# Patient Record
Sex: Female | Born: 1975 | Race: Black or African American | Hispanic: No | Marital: Single | Smoking: Never smoker
Health system: Southern US, Community
[De-identification: ages and names within clinical notes are randomized; demographics above are authoritative.]

## PROBLEM LIST (undated history)

## (undated) DIAGNOSIS — D251 Intramural leiomyoma of uterus: Secondary | ICD-10-CM

## (undated) DIAGNOSIS — T7840XA Allergy, unspecified, initial encounter: Secondary | ICD-10-CM

## (undated) DIAGNOSIS — R51 Headache: Secondary | ICD-10-CM

## (undated) DIAGNOSIS — L709 Acne, unspecified: Secondary | ICD-10-CM

## (undated) DIAGNOSIS — I10 Essential (primary) hypertension: Secondary | ICD-10-CM

## (undated) DIAGNOSIS — K219 Gastro-esophageal reflux disease without esophagitis: Secondary | ICD-10-CM

## (undated) DIAGNOSIS — D649 Anemia, unspecified: Secondary | ICD-10-CM

## (undated) HISTORY — DX: Allergy, unspecified, initial encounter: T78.40XA

## (undated) HISTORY — DX: Anemia, unspecified: D64.9

## (undated) HISTORY — DX: Essential (primary) hypertension: I10

## (undated) HISTORY — PX: WISDOM TOOTH EXTRACTION: SHX21

## (undated) HISTORY — DX: Headache: R51

## (undated) HISTORY — DX: Gastro-esophageal reflux disease without esophagitis: K21.9

## (undated) HISTORY — DX: Acne, unspecified: L70.9

## (undated) SURGERY — Surgical Case
Anesthesia: *Unknown

---

## 1999-11-17 ENCOUNTER — Emergency Department (HOSPITAL_COMMUNITY): Admission: EM | Admit: 1999-11-17 | Discharge: 1999-11-17 | Payer: Self-pay | Admitting: Emergency Medicine

## 1999-12-09 ENCOUNTER — Encounter: Payer: Self-pay | Admitting: Emergency Medicine

## 1999-12-09 ENCOUNTER — Emergency Department (HOSPITAL_COMMUNITY): Admission: EM | Admit: 1999-12-09 | Discharge: 1999-12-09 | Payer: Self-pay | Admitting: Emergency Medicine

## 2004-06-11 ENCOUNTER — Emergency Department (HOSPITAL_COMMUNITY): Admission: EM | Admit: 2004-06-11 | Discharge: 2004-06-11 | Payer: Self-pay | Admitting: Family Medicine

## 2005-05-23 ENCOUNTER — Ambulatory Visit: Payer: Self-pay | Admitting: Internal Medicine

## 2007-03-19 DIAGNOSIS — J309 Allergic rhinitis, unspecified: Secondary | ICD-10-CM

## 2007-03-25 ENCOUNTER — Ambulatory Visit: Payer: Self-pay | Admitting: Internal Medicine

## 2007-03-29 ENCOUNTER — Telehealth (INDEPENDENT_AMBULATORY_CARE_PROVIDER_SITE_OTHER): Payer: Self-pay | Admitting: *Deleted

## 2007-03-29 LAB — CONVERTED CEMR LAB
ALT: 24 units/L (ref 0–35)
AST: 27 units/L (ref 0–37)
BUN: 13 mg/dL (ref 6–23)
Basophils Absolute: 0 10*3/uL (ref 0.0–0.1)
Basophils Relative: 0.6 % (ref 0.0–1.0)
CO2: 28 meq/L (ref 19–32)
Calcium: 9.1 mg/dL (ref 8.4–10.5)
Chloride: 109 meq/L (ref 96–112)
Cholesterol: 180 mg/dL (ref 0–200)
Creatinine, Ser: 0.8 mg/dL (ref 0.4–1.2)
Eosinophils Absolute: 0.1 10*3/uL (ref 0.0–0.6)
Eosinophils Relative: 1.4 % (ref 0.0–5.0)
GFR calc Af Amer: 106 mL/min
GFR calc non Af Amer: 88 mL/min
Glucose, Bld: 74 mg/dL (ref 70–99)
HCT: 40 % (ref 36.0–46.0)
HDL: 57.5 mg/dL (ref >39.0)
Hemoglobin: 13.3 g/dL (ref 12.0–15.0)
LDL Cholesterol: 111 mg/dL — ABNORMAL HIGH (ref 0–99)
Lymphocytes Relative: 47.6 % — ABNORMAL HIGH (ref 12.0–46.0)
MCHC: 33.2 g/dL (ref 30.0–36.0)
MCV: 86.8 fL (ref 78.0–100.0)
Monocytes Absolute: 0.7 10*3/uL (ref 0.2–0.7)
Monocytes Relative: 12.7 % — ABNORMAL HIGH (ref 3.0–11.0)
Neutro Abs: 2.2 10*3/uL (ref 1.4–7.7)
Neutrophils Relative %: 37.7 % — ABNORMAL LOW (ref 43.0–77.0)
Platelets: 280 10*3/uL (ref 150–400)
Potassium: 4.1 meq/L (ref 3.5–5.1)
RBC: 4.61 M/uL (ref 3.87–5.11)
RDW: 12.5 % (ref 11.5–14.6)
Sodium: 141 meq/L (ref 135–145)
TSH: 1.78 microintl units/mL (ref 0.35–5.50)
Total CHOL/HDL Ratio: 3.1
Triglycerides: 59 mg/dL (ref 0–149)
VLDL: 12 mg/dL (ref 0–40)
WBC: 5.8 10*3/uL (ref 4.5–10.5)

## 2007-12-27 ENCOUNTER — Telehealth (INDEPENDENT_AMBULATORY_CARE_PROVIDER_SITE_OTHER): Payer: Self-pay | Admitting: *Deleted

## 2007-12-29 ENCOUNTER — Ambulatory Visit: Payer: Self-pay | Admitting: Internal Medicine

## 2007-12-29 DIAGNOSIS — K648 Other hemorrhoids: Secondary | ICD-10-CM | POA: Insufficient documentation

## 2007-12-29 LAB — CONVERTED CEMR LAB
Bilirubin Urine: NEGATIVE
Nitrite: NEGATIVE
Protein, U semiquant: NEGATIVE
Specific Gravity, Urine: 1.01
Urobilinogen, UA: NEGATIVE

## 2007-12-30 ENCOUNTER — Encounter: Payer: Self-pay | Admitting: Internal Medicine

## 2007-12-30 LAB — CONVERTED CEMR LAB: WBC, UA: NONE SEEN cells/hpf (ref <3)

## 2008-01-05 ENCOUNTER — Ambulatory Visit: Payer: Self-pay | Admitting: Internal Medicine

## 2008-01-05 DIAGNOSIS — I10 Essential (primary) hypertension: Secondary | ICD-10-CM

## 2008-01-05 DIAGNOSIS — R51 Headache: Secondary | ICD-10-CM

## 2008-01-05 DIAGNOSIS — R519 Headache, unspecified: Secondary | ICD-10-CM | POA: Insufficient documentation

## 2008-01-07 LAB — CONVERTED CEMR LAB
BUN: 14 mg/dL (ref 6–23)
Creatinine, Ser: 0.7 mg/dL (ref 0.4–1.2)
Potassium: 3.9 meq/L (ref 3.5–5.1)

## 2008-01-10 ENCOUNTER — Telehealth (INDEPENDENT_AMBULATORY_CARE_PROVIDER_SITE_OTHER): Payer: Self-pay | Admitting: *Deleted

## 2008-01-10 ENCOUNTER — Encounter (INDEPENDENT_AMBULATORY_CARE_PROVIDER_SITE_OTHER): Payer: Self-pay | Admitting: *Deleted

## 2008-01-11 ENCOUNTER — Telehealth: Payer: Self-pay | Admitting: Internal Medicine

## 2008-01-19 ENCOUNTER — Encounter: Payer: Self-pay | Admitting: Internal Medicine

## 2008-01-21 ENCOUNTER — Ambulatory Visit: Payer: Self-pay | Admitting: Internal Medicine

## 2008-01-26 ENCOUNTER — Encounter: Payer: Self-pay | Admitting: Internal Medicine

## 2008-02-14 ENCOUNTER — Ambulatory Visit: Payer: Self-pay | Admitting: Internal Medicine

## 2008-02-17 LAB — CONVERTED CEMR LAB
BUN: 14 mg/dL (ref 6–23)
Creatinine, Ser: 0.9 mg/dL (ref 0.4–1.2)
GFR calc Af Amer: 92 mL/min
GFR calc non Af Amer: 76 mL/min

## 2008-03-20 ENCOUNTER — Telehealth (INDEPENDENT_AMBULATORY_CARE_PROVIDER_SITE_OTHER): Payer: Self-pay | Admitting: *Deleted

## 2008-04-06 ENCOUNTER — Telehealth: Payer: Self-pay | Admitting: Internal Medicine

## 2008-04-17 ENCOUNTER — Encounter: Payer: Self-pay | Admitting: Internal Medicine

## 2008-04-24 ENCOUNTER — Telehealth (INDEPENDENT_AMBULATORY_CARE_PROVIDER_SITE_OTHER): Payer: Self-pay | Admitting: *Deleted

## 2008-06-16 ENCOUNTER — Ambulatory Visit: Payer: Self-pay | Admitting: Internal Medicine

## 2008-06-19 ENCOUNTER — Encounter: Admission: RE | Admit: 2008-06-19 | Discharge: 2008-06-19 | Payer: Self-pay | Admitting: Obstetrics & Gynecology

## 2008-07-06 ENCOUNTER — Encounter: Payer: Self-pay | Admitting: Internal Medicine

## 2008-07-06 ENCOUNTER — Encounter: Admission: RE | Admit: 2008-07-06 | Discharge: 2008-07-31 | Payer: Self-pay | Admitting: Internal Medicine

## 2008-07-11 ENCOUNTER — Telehealth (INDEPENDENT_AMBULATORY_CARE_PROVIDER_SITE_OTHER): Payer: Self-pay | Admitting: *Deleted

## 2008-07-12 ENCOUNTER — Ambulatory Visit: Payer: Self-pay | Admitting: Internal Medicine

## 2008-07-30 DIAGNOSIS — R519 Headache, unspecified: Secondary | ICD-10-CM

## 2008-07-30 HISTORY — DX: Headache, unspecified: R51.9

## 2008-08-04 ENCOUNTER — Encounter: Admission: RE | Admit: 2008-08-04 | Discharge: 2008-08-04 | Payer: Self-pay | Admitting: Internal Medicine

## 2008-08-04 ENCOUNTER — Telehealth (INDEPENDENT_AMBULATORY_CARE_PROVIDER_SITE_OTHER): Payer: Self-pay | Admitting: *Deleted

## 2008-08-08 ENCOUNTER — Encounter: Admission: RE | Admit: 2008-08-08 | Discharge: 2008-08-08 | Payer: Self-pay | Admitting: Internal Medicine

## 2008-08-09 ENCOUNTER — Telehealth (INDEPENDENT_AMBULATORY_CARE_PROVIDER_SITE_OTHER): Payer: Self-pay | Admitting: *Deleted

## 2008-08-17 ENCOUNTER — Ambulatory Visit: Payer: Self-pay | Admitting: Cardiology

## 2008-08-18 ENCOUNTER — Telehealth (INDEPENDENT_AMBULATORY_CARE_PROVIDER_SITE_OTHER): Payer: Self-pay | Admitting: *Deleted

## 2008-08-29 ENCOUNTER — Telehealth (INDEPENDENT_AMBULATORY_CARE_PROVIDER_SITE_OTHER): Payer: Self-pay | Admitting: *Deleted

## 2008-09-27 ENCOUNTER — Encounter: Payer: Self-pay | Admitting: Internal Medicine

## 2008-10-20 ENCOUNTER — Encounter: Admission: RE | Admit: 2008-10-20 | Discharge: 2009-01-18 | Payer: Self-pay | Admitting: Internal Medicine

## 2008-12-13 ENCOUNTER — Ambulatory Visit: Payer: Self-pay | Admitting: Internal Medicine

## 2008-12-14 ENCOUNTER — Ambulatory Visit: Payer: Self-pay | Admitting: Internal Medicine

## 2008-12-19 LAB — CONVERTED CEMR LAB
BUN: 14 mg/dL (ref 6–23)
Basophils Relative: 0.9 % (ref 0.0–3.0)
Calcium: 8.9 mg/dL (ref 8.4–10.5)
Creatinine, Ser: 0.7 mg/dL (ref 0.4–1.2)
Eosinophils Relative: 0.9 % (ref 0.0–5.0)
GFR calc non Af Amer: 122.44 mL/min (ref >60)
Glucose, Bld: 80 mg/dL (ref 70–99)
HCT: 38.6 % (ref 36.0–46.0)
Hemoglobin: 12.8 g/dL (ref 12.0–15.0)
Lymphs Abs: 2.7 10*3/uL (ref 0.7–4.0)
MCV: 88.3 fL (ref 78.0–100.0)
Monocytes Absolute: 0.7 10*3/uL (ref 0.1–1.0)
Neutro Abs: 2.1 10*3/uL (ref 1.4–7.7)
Potassium: 3.4 meq/L — ABNORMAL LOW (ref 3.5–5.1)
RBC: 4.37 M/uL (ref 3.87–5.11)
WBC: 5.7 10*3/uL (ref 4.5–10.5)

## 2009-08-10 IMAGING — CT CT ANGIO HEAD
2 of 3 series · 5 of 30 positions shown · IV contrast (agent unspecified)
Comparison: 08/08/2008

CLINICAL DATA: Possible right pica aneurysm on the MR angiography

CT ANGIOGRAPHY HEAD
TECHNIQUE: Multidetector CT imaging of the head was performed
using the standard protocol prior to and during bolus
administration of intravenous contrast. Multiplanar CT image
reconstructions including MIPs were obtained to evaluate the
vascular anatomy.
Contrast: 100 ml [id number]

[Series 3: head_w/o 3.0 h20f · axial · 0.45mm/px · z∈[-104,-26]mm · 3 of 54 slices shown]
[im 14/54  brain]
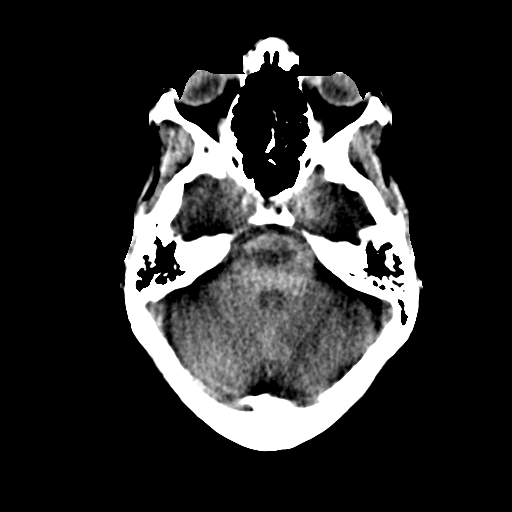
[im 27/54  bone]
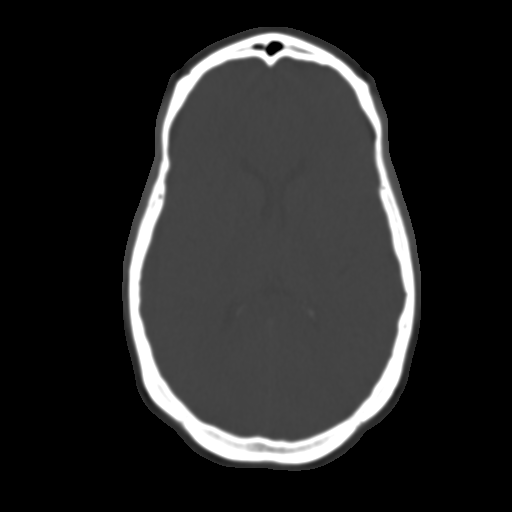
[im 40/54  brain]
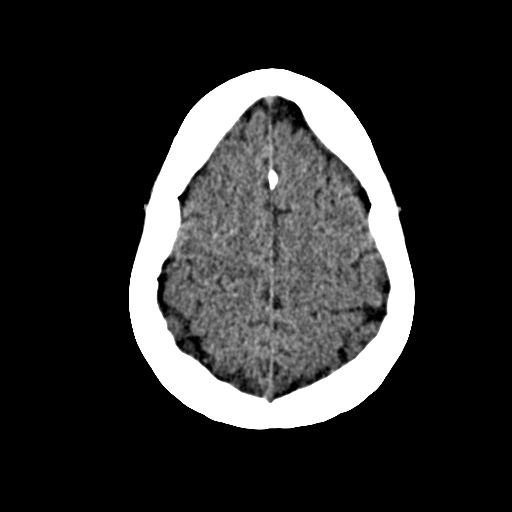

[Series 5: headangio 3.0 h20f · axial · 0.45mm/px · z∈[-90,-36]mm · 2 of 54 slices shown]
[im 18/54  brain]
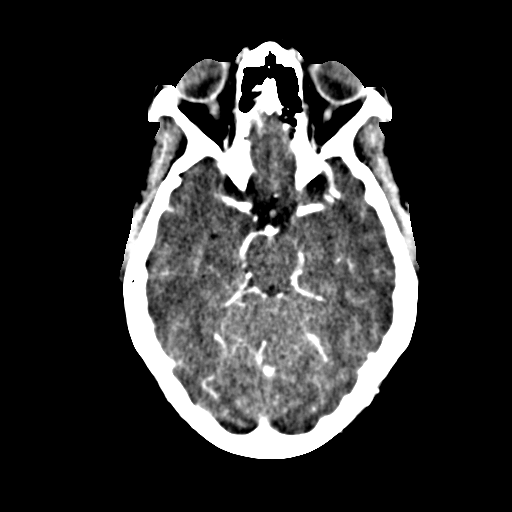
[im 36/54  brain]
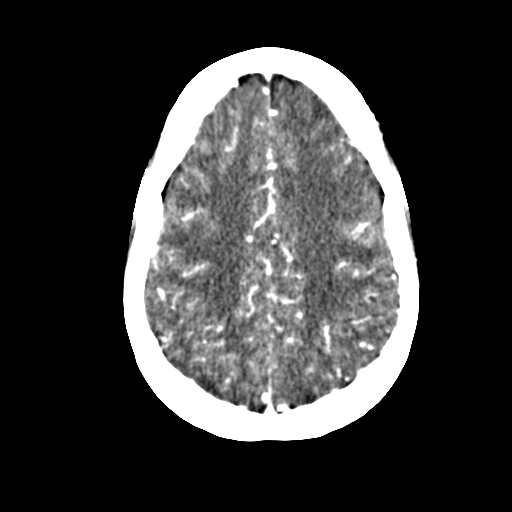

[5 of 30 positions shown; findings below may reference images not displayed]

FINDINGS: I believe the examination is normal.  Scrutiny of the
right vertebral artery at the pica origin shows what I believe is
normal anatomy.  I think the MR appearance related to the
angulation of the vessel at that location.  The anterior
circulation appears likewise normal.
IMPRESSION: CT angiography within normal limits.  Specific attention to the
right vertebral artery and right pica shows what I believe is a
normal appearance.  There is an angulation of the vessel that is
probably responsible for the MR appearance.

## 2009-12-13 ENCOUNTER — Telehealth (INDEPENDENT_AMBULATORY_CARE_PROVIDER_SITE_OTHER): Payer: Self-pay | Admitting: *Deleted

## 2009-12-19 ENCOUNTER — Encounter (INDEPENDENT_AMBULATORY_CARE_PROVIDER_SITE_OTHER): Payer: Self-pay | Admitting: *Deleted

## 2010-01-18 LAB — CONVERTED CEMR LAB: Pap Smear: NORMAL

## 2010-01-21 ENCOUNTER — Ambulatory Visit: Payer: Self-pay | Admitting: Internal Medicine

## 2010-01-21 DIAGNOSIS — D259 Leiomyoma of uterus, unspecified: Secondary | ICD-10-CM | POA: Insufficient documentation

## 2010-01-25 LAB — CONVERTED CEMR LAB
ALT: 16 units/L (ref 0–35)
AST: 21 units/L (ref 0–37)
Basophils Relative: 0.7 % (ref 0.0–3.0)
CO2: 28 meq/L (ref 19–32)
Chloride: 103 meq/L (ref 96–112)
Creatinine, Ser: 0.8 mg/dL (ref 0.4–1.2)
Eosinophils Absolute: 0.1 10*3/uL (ref 0.0–0.7)
LDL Cholesterol: 96 mg/dL (ref 0–99)
MCHC: 32.5 g/dL (ref 30.0–36.0)
MCV: 89.5 fL (ref 78.0–100.0)
Monocytes Absolute: 0.7 10*3/uL (ref 0.1–1.0)
Neutro Abs: 1.7 10*3/uL (ref 1.4–7.7)
Neutrophils Relative %: 36.6 % — ABNORMAL LOW (ref 43.0–77.0)
RBC: 4.82 M/uL (ref 3.87–5.11)
TSH: 1.3 microintl units/mL (ref 0.35–5.50)
Total CHOL/HDL Ratio: 3

## 2010-10-29 NOTE — Letter (Signed)
Summary: Primary Care Appointment Letter  [facility]  [address] [place], [place] [address]   Phone: [phone]  Fax: [phone]    12/19/2009 MRN: [id number]  Parilee Thomason [address] [address] [place], [place]  [address]  Dear Ms. Odriscoll,   Your Primary Care Physician [place] E. Paz MD has indicated that:    _____x__it is time to schedule an appointment. Please call and schedule an physical.    _______you missed your appointment on______ and need to call and          reschedule.    _______you need to have lab work done.    _______you need to schedule an appointment discuss lab or test results.    _______you need to call to reschedule your appointment that is                       scheduled on _________.     Please call our office as soon as possible. Our phone number is [phone]_______. Please press option 1. Our office is open 8a-12noon and 1p-5p, Monday through Friday.     Thank you,    [facility]

## 2010-10-29 NOTE — Assessment & Plan Note (Signed)
Summary: cpx/ns/kdc   Vital Signs:  Patient profile:   35 year old female Height:      66.5 inches Weight:      163 pounds BMI:     26.01 Pulse rate:   74 / minute BP sitting:   142 / 90  Vitals Entered By: Shary Decamp (January 21, 2010 8:57 AM) CC: cpx - fasting Comments BP @ gyn last week 106/65 Shary Decamp  January 21, 2010 9:01 AM    History of Present Illness: cpx - fasting BP @ gyn last week 106/65  Allergies: No Known Drug Allergies  Past History:  Past Medical History: Allergic rhinitis Acne G1P0 HTN h/o hemorrhoids HAs 07-2008: MRI (-) , MRA 2 aneurysms? ----> CT angio (-) ; saw neuro, likely migraines , Rx imitrex    Past Surgical History: Reviewed history from 12/29/2007 and no changes required. none  Family History: Reviewed history from 12/13/2008 and no changes required. CAD-- GM in her 60s  Diabetes --uncle Hypertension-- M, M aunts, MGM Leukemia--cousin breast ca-- aunt  colon ca--no  Social History: Reviewed history from 12/13/2008 and no changes required.  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 CV:  Denies chest pain or discomfort and swelling of feet. Resp:  Denies cough and shortness of breath. GI:  Denies bloody stools, diarrhea, nausea, and vomiting. Neuro:  rarely has headaches, maybe once a month. Psych:  Denies anxiety and depression. Allergy:  allergy symptoms increased lately, mostly nose congestion, very mild eye congestion.  Physical Exam  General:  alert, well-developed, and well-nourished.   Lungs:  normal respiratory effort, no intercostal retractions, no accessory muscle use, and normal breath sounds.   Heart:  normal rate, regular rhythm, no murmur, and no gallop.   Abdomen:  soft, non-tender, no distention, no guarding, no rigidity, and no rebound  tenderness.  has a hard-not tender mass bilaterally in the pelvis, it goes up to 1.5-inch below the umbilicus Extremities:  no pretibial edema bilaterally  Psych:  Cognition and judgment appear intact. Alert and cooperative with normal attention span and concentration. not anxious appearing and not depressed appearing.     Impression & Recommendations:  Problem # 1:  HEALTH MAINTENANCE EXAM (ICD-V70.0)  doing well Td 2008 just saw her  gyn , everithing was ok  labs   Orders: Venipuncture (84696) TLB-BMP (Basic Metabolic Panel-BMET) (80048-METABOL) TLB-CBC Platelet - w/Differential (85025-CBCD) TLB-Lipid Panel (80061-LIPID) TLB-ALT (SGPT) (84460-ALT) TLB-AST (SGOT) (84450-SGOT) TLB-TSH (Thyroid Stimulating Hormone) (84443-TSH)  Problem # 2:  FIBROIDS, UTERUS (ICD-218.9) see physical exam, has a pelvic mass stated that she has uterine  fibroids, this is  closely follow by her gynecologist, a ultrasound is done every few months. She was seen by gynecologist lately, the patient reports she was examined in the abdomen and again was told she has uterine fibroids  Problem # 3:  HEADACHE (ICD-784.0) well controlled  Her updated medication list for this problem includes:    Sumatriptan Succinate 50 Mg Tabs (Sumatriptan succinate) .Marland Kitchen... 1 tab by mouth w/ onset of ha , may repeat in 2 hours if ha worse. no more than 2 tabs a day  Problem # 4:  HYPERTENSION (ICD-401.9) BP slightly elevated today, usually okay , see  instructions Her updated medication list for this problem includes:    Diltiazem Hcl 90 Mg Tabs (Diltiazem hcl) .Marland KitchenMarland KitchenMarland KitchenMarland Kitchen  1/2 two times a day - due office visit for additional refills    Maxzide-25 37.5-25 Mg Tabs (Triamterene-hctz) .Marland Kitchen... 1 by mouth once daily - due office visit for additional refills  BP today: 142/90 Prior BP: 118/80 (12/13/2008)  Prior 10 Yr Risk Heart Disease: 1 % (01/05/2008)  Labs Reviewed: K+: 3.4 (12/14/2008) Creat: : 0.7 (12/14/2008)   Chol: 190  (12/14/2008)   HDL: 52.80 (12/14/2008)   LDL: 127 (12/14/2008)   TG: 53.0 (12/14/2008)  Problem # 5:  ALLERGIC RHINITIS (ICD-477.9) she takes Claritin over the counter, will add Nasonex Her updated medication list for this problem includes:    Nasonex 50 Mcg/act Susp (Mometasone furoate) .Marland Kitchen... 2 sprays on each side of the nose  Complete Medication List: 1)  Ortho Micronor 0.35 Mg Tabs (Norethindrone (contraceptive)) .... Take as directed 2)  Diltiazem Hcl 90 Mg Tabs (Diltiazem hcl) .... 1/2 two times a day - due office visit for additional refills 3)  Maxzide-25 37.5-25 Mg Tabs (Triamterene-hctz) .Marland Kitchen.. 1 by mouth once daily - due office visit for additional refills 4)  Sumatriptan Succinate 50 Mg Tabs (Sumatriptan succinate) .Marland Kitchen.. 1 tab by mouth w/ onset of ha , may repeat in 2 hours if ha worse. no more than 2 tabs a day 5)  Metoclopramide Hcl 10 Mg Tabs (Metoclopramide hcl) .Marland Kitchen.. 1 by mouth q 4 hours as needed nausea 6)  Nasonex 50 Mcg/act Susp (Mometasone furoate) .... 2 sprays on each side of the nose  Patient Instructions: 1)  Check your blood pressure 2 or 3 times a week. If it is more than 140/85 consistently,please let us know  2)  Please schedule a follow-up appointment in 1 year (physical exam) Prescriptions: NASONEX 50 MCG/ACT SUSP (MOMETASONE FUROATE) 2 sprays on each side of the nose  #1 x 6   Entered and Authorized by:   Elita Quick E. Paz MD   Signed by:   Nolon Rod. Paz MD on 01/21/2010   Method used:   Print then Give to Patient   RxID:   [id number]    Preventive Care Screening  Pap Smear:    Date:  01/18/2010    Results:  normal   Prior Values:    Last Tetanus Booster:  Tdap (03/25/2007)

## 2010-10-29 NOTE — Progress Notes (Signed)
Summary: due cpx  Phone Note Outgoing Call Call back at [facility] Phone [phone] Call back at Work Phone [phone]   Summary of Call: DUE CPX Shary Decamp  December 13, 2009 10:00 AM     Additional Follow-up for Phone Call Additional follow up Details #2::    LMTCB.Marland KitchenMarland KitchenBarb Merino  December 13, 2009 10:12 AM  mailed a letter.Marland KitchenMarland KitchenMarland KitchenBarb Merino  December 19, 2009 8:30 AM  Follow-up by: Barb Merino,  December 13, 2009 10:12 AM

## 2010-12-05 ENCOUNTER — Encounter: Payer: Self-pay | Admitting: Internal Medicine

## 2011-01-23 ENCOUNTER — Encounter: Payer: Self-pay | Admitting: Internal Medicine

## 2011-01-23 ENCOUNTER — Ambulatory Visit (INDEPENDENT_AMBULATORY_CARE_PROVIDER_SITE_OTHER): Payer: BC Managed Care – PPO | Admitting: Internal Medicine

## 2011-01-23 DIAGNOSIS — D259 Leiomyoma of uterus, unspecified: Secondary | ICD-10-CM

## 2011-01-23 DIAGNOSIS — Z0000 Encounter for general adult medical examination without abnormal findings: Secondary | ICD-10-CM

## 2011-01-23 DIAGNOSIS — I10 Essential (primary) hypertension: Secondary | ICD-10-CM

## 2011-01-23 LAB — CBC WITH DIFFERENTIAL/PLATELET
Basophils Relative: 0.8 % (ref 0.0–3.0)
Eosinophils Relative: 0.8 % (ref 0.0–5.0)
HCT: 41.5 % (ref 36.0–46.0)
Lymphs Abs: 2.8 10*3/uL (ref 0.7–4.0)
MCV: 88.5 fl (ref 78.0–100.0)
Monocytes Relative: 12.4 % — ABNORMAL HIGH (ref 3.0–12.0)
Platelets: 308 10*3/uL (ref 150.0–400.0)
RBC: 4.7 Mil/uL (ref 3.87–5.11)
WBC: 6.6 10*3/uL (ref 4.5–10.5)

## 2011-01-23 LAB — LIPID PANEL
Total CHOL/HDL Ratio: 4
Triglycerides: 46 mg/dL (ref 0.0–149.0)

## 2011-01-23 LAB — LDL CHOLESTEROL, DIRECT: Direct LDL: 134.2 mg/dL

## 2011-01-23 NOTE — Assessment & Plan Note (Signed)
Large fibroids, f/u by gyn

## 2011-01-23 NOTE — Patient Instructions (Signed)
Check your BP twice a month, call if > 140/85 consistently

## 2011-01-23 NOTE — Progress Notes (Signed)
  Subjective:    Patient ID: Miranda [name], female    DOB: 06-23-1976, 35 y.o.   MRN: [id number]  HPI CPX Feels well , admits not to be very active   Past Medical History  Diagnosis Date  . Allergic rhinitis   . Acne   . Hypertension   . Hemorrhoids   . HA (headache) 07/2008    MRI (-), MRA 2 anerurysms?------> CT angio (-); saw Neuro, likely migraines, Rx imitrex   No past surgical history on file. Family History  Problem Relation Age of Onset  . Coronary artery disease      GM in her 60s  . Diabetes      uncle  . Hypertension Mother     M, aunt, GM  . Leukemia      cousin  . Breast cancer      aunt  . Colon cancer Neg Hx    History   Social History  . Marital Status: Single    Spouse Name: N/A    Number of Children: 0  . Years of Education: N/A   Occupational History  . [employer]    Social History Main Topics  . Smoking status: Never Smoker   . Smokeless tobacco: Not on file  . Alcohol Use: Yes  . Drug Use: Not on file  . Sexually Active: Not on file   Other Topics Concern  . Not on file   Social History Narrative   Exercise-- not frequently .Marland KitchenMarland KitchenMarland KitchenDiet-- not very healthy     Review of Systems  Constitutional: Negative for fever and unexpected weight change.       Slt fatigue, can't sleep well, thinks b/c goes to school at night and can't relax  Respiratory: Negative for cough and shortness of breath.   Cardiovascular: Negative for chest pain and leg swelling.  Gastrointestinal: Negative for nausea, abdominal pain, diarrhea and blood in stool.  Genitourinary: Negative for dysuria and hematuria.  Psychiatric/Behavioral: The patient is not nervous/anxious.        Objective:   Physical Exam  Constitutional: She is oriented to person, place, and time. She appears well-developed and well-nourished.  Eyes: No scleral icterus.  Neck: No thyromegaly present.       Normal carotid pulse  Cardiovascular: Normal rate, regular rhythm and normal heart  sounds.   No murmur heard. Pulmonary/Chest: Effort normal and breath sounds normal. No respiratory distress. She has no wheezes. She has no rales.  Abdominal: Soft. Bowel sounds are normal. She exhibits no distension. There is no tenderness. There is no rebound and no guarding.    Musculoskeletal: She exhibits no edema.  Neurological: She is alert and oriented to person, place, and time.  Skin: Skin is warm and dry.  Psychiatric: She has a normal mood and affect. Her behavior is normal. Judgment and thought content normal.          Assessment & Plan:

## 2011-01-23 NOTE — Assessment & Plan Note (Signed)
Cont same meds, check BP x 2 /month

## 2011-01-23 NOTE — Assessment & Plan Note (Signed)
Td 08 Diet-exercise discussed  Labs Sees gyn regularly, next appointment 2 weeks

## 2011-01-27 ENCOUNTER — Telehealth: Payer: Self-pay | Admitting: *Deleted

## 2011-01-27 NOTE — Telephone Encounter (Signed)
Message copied by Army Fossa on Mon Jan 27, 2011 10:32 AM ------      Message from: Miranda [name]      Created: Sun Jan 26, 2011 10:15 AM       Advise patient:      Labs are WNL, cholesterol used to a little better: watch diet, exercise more

## 2011-01-27 NOTE — Telephone Encounter (Signed)
Message left for patient to return my call.  

## 2011-01-28 NOTE — Telephone Encounter (Signed)
Pt is aware.  

## 2011-01-28 NOTE — Telephone Encounter (Signed)
Message left for patient to return my call.  

## 2011-02-09 ENCOUNTER — Other Ambulatory Visit: Payer: Self-pay | Admitting: Internal Medicine

## 2011-05-28 ENCOUNTER — Encounter: Payer: Self-pay | Admitting: Family Medicine

## 2011-05-28 ENCOUNTER — Ambulatory Visit (INDEPENDENT_AMBULATORY_CARE_PROVIDER_SITE_OTHER): Payer: BC Managed Care – PPO | Admitting: Family Medicine

## 2011-05-28 DIAGNOSIS — H6990 Unspecified Eustachian tube disorder, unspecified ear: Secondary | ICD-10-CM | POA: Insufficient documentation

## 2011-05-28 DIAGNOSIS — H6980 Other specified disorders of Eustachian tube, unspecified ear: Secondary | ICD-10-CM

## 2011-05-28 MED ORDER — MOMETASONE FUROATE 50 MCG/ACT NA SUSP: 2.0000 | Freq: Every day | NASAL | Status: DC

## 2011-05-28 NOTE — Assessment & Plan Note (Signed)
Pt's ear pain is due to eustachian tube dysfxn.  Start nasal steroid, OTC antihistamine.  Reviewed supportive care and red flags that should prompt return.  Pt expressed understanding and is in agreement w/ plan.

## 2011-05-28 NOTE — Progress Notes (Signed)
  Subjective:    Patient ID: Miranda [name], female    DOB: 12-20-1975, 35 y.o.   MRN: [id number]  HPI Ear pain- went to [facility] on 8/14 and was dx'd w/ ear and sinus infxn.  Started abx.  Ear pain was worsening- called [facility] and was told to start Coricidin and Mucinex.  Pain improved.  Has been off abx x4 days.  Now having 'slight pain, it feels swollen'.  L>R.  Minimal nasal congestion.  No sore throat.  No fevers.   Review of Systems For ROS see HPI     Objective:   Physical Exam  Vitals reviewed. Constitutional: She appears well-developed and well-nourished. No distress.  HENT:  Head: Normocephalic and atraumatic.  Mouth/Throat: Oropharynx is clear and moist.       TMs retracted bilaterally but otherwise normal Nasal turbinate edema + PND  Eyes: Conjunctivae and EOM are normal. Pupils are equal, round, and reactive to light.  Neck: Normal range of motion. Neck supple.  Cardiovascular: Normal rate, regular rhythm and normal heart sounds.   Pulmonary/Chest: Effort normal and breath sounds normal. No respiratory distress. She has no wheezes.  Lymphadenopathy:    She has no cervical adenopathy.          Assessment & Plan:

## 2011-05-28 NOTE — Patient Instructions (Signed)
This appears to be related to nasal congestion/allergies Start the nasal spray- 2 sprays each nostril daily You can add Claritin or Zyrtec as needed for allergy symptoms Call with any questions or concerns Hang in there!

## 2011-07-10 ENCOUNTER — Other Ambulatory Visit: Payer: Self-pay | Admitting: Internal Medicine

## 2011-07-10 NOTE — Telephone Encounter (Signed)
Done

## 2011-10-04 ENCOUNTER — Other Ambulatory Visit: Payer: Self-pay | Admitting: Internal Medicine

## 2011-10-12 ENCOUNTER — Encounter (HOSPITAL_COMMUNITY): Payer: Self-pay

## 2011-10-20 NOTE — H&P (Signed)
Miranda [name] is an 36 y.o. female. G1P0 with fibroid uterus for myomectomy.  D/W pt options including hysterectomy, myomectomy, uterine artery embolization, expectant management - including r/b/a.  Pt wishes to maintain fertility, so myomectomy is best option.  Pt has dyspaurenia secondary to size of uterus.  Denies heavy bleeding.  Pertinent Gynecological History: Menses: regular every month without intermenstrual spotting Bleeding: N/A Contraception: OCP (estrogen/progesterone) Sexually transmitted diseases: no past history Previous GYN Procedures: none  Last pap: normal  OB History: G1, P0010      Past Medical History  Diagnosis Date  . Allergic rhinitis   . Acne   . Hypertension   . Hemorrhoids   . HA (headache) 07/2008    MRI (-), MRA 2 anerurysms?------> CT angio (-); saw Neuro, likely migraines, Rx imitrex    No past surgical history on file.  Family History  Problem Relation Age of Onset  . Coronary artery disease      GM in her 60s  . Diabetes      uncle  . Hypertension Mother     M, aunt, GM  . Leukemia      cousin  . Breast cancer      aunt  . Colon cancer Neg Hx     Social History:  reports that she has never smoked. She does not have any smokeless tobacco history on file. She reports that she drinks alcohol. Her drug history not on file. single  Allergies: No Known Allergies Meds: none All: NKDA   Review of Systems  Constitutional: Negative.   HENT: Negative.   Eyes: Negative.   Respiratory: Negative.   Cardiovascular: Negative.   Gastrointestinal: Negative.   Genitourinary: Negative.   Musculoskeletal: Negative.   Skin: Negative.   Neurological: Negative.   Psychiatric/Behavioral: Negative.     AF VSS Physical Exam  Constitutional: She is oriented to person, place, and time. She appears well-developed and well-nourished.  HENT:  Head: Normocephalic and atraumatic.  Eyes: Conjunctivae are normal. Pupils are equal, round, and reactive to  light.  Neck: Normal range of motion. Neck supple. No thyromegaly present.  Cardiovascular: Normal rate and regular rhythm.   Respiratory: Effort normal and breath sounds normal. No respiratory distress.  GI: Soft. Bowel sounds are normal. She exhibits no distension. There is no tenderness.       Uterus palpable 2 fingerbreadths below umbilicus  Genitourinary: Vagina normal.       Uterus, obvious fibroids, mobile 14-16wk size  Musculoskeletal: Normal range of motion.  Neurological: She is alert and oriented to person, place, and time.  Skin: Skin is warm and dry.  Psychiatric: She has a normal mood and affect. Her behavior is normal.     Assessment/Plan: 35yo G1P0 for myomectomy, discussed with patient R/B/A, will proceed  BOVARD,Rovena Hearld 10/20/2011, 2:26 PM

## 2011-10-21 ENCOUNTER — Other Ambulatory Visit: Payer: Self-pay

## 2011-10-21 ENCOUNTER — Encounter (HOSPITAL_COMMUNITY)
Admission: RE | Admit: 2011-10-21 | Discharge: 2011-10-21 | Disposition: A | Discharge status: Home / Self Care | Payer: BC Managed Care – PPO | Source: Ambulatory Visit | Attending: Obstetrics and Gynecology | Admitting: Obstetrics and Gynecology

## 2011-10-21 ENCOUNTER — Encounter (HOSPITAL_COMMUNITY): Payer: Self-pay

## 2011-10-21 LAB — COMPREHENSIVE METABOLIC PANEL
ALT: 29 U/L (ref 0–35)
AST: 25 U/L (ref 0–37)
Albumin: 3.6 g/dL (ref 3.5–5.2)
Alkaline Phosphatase: 55 U/L (ref 39–117)
CO2: 25 mEq/L (ref 19–32)
Chloride: 103 mEq/L (ref 96–112)
Creatinine, Ser: 0.74 mg/dL (ref 0.50–1.10)
GFR calc non Af Amer: >90 mL/min (ref >90)
Potassium: 3.4 mEq/L — ABNORMAL LOW (ref 3.5–5.1)
Sodium: 139 mEq/L (ref 135–145)
Total Bilirubin: 0.2 mg/dL — ABNORMAL LOW (ref 0.3–1.2)

## 2011-10-21 LAB — CBC
MCV: 84.6 fL (ref 78.0–100.0)
Platelets: 318 10*3/uL (ref 150–400)
RBC: 4.88 MIL/uL (ref 3.87–5.11)
WBC: 5.1 10*3/uL (ref 4.0–10.5)

## 2011-10-21 LAB — SURGICAL PCR SCREEN: Staphylococcus aureus: NEGATIVE

## 2011-10-21 NOTE — Patient Instructions (Signed)
   Your procedure is scheduled on: Thursday, Jan 24th  Enter through the Main Entrance of [facility] at: 830am Pick up the phone at the desk and dial [phone] and inform us of your arrival.  Please call this number if you have any problems the morning of surgery: [phone]  Remember: Do not eat food after midnight: Wednesday Do not drink clear liquids after: Wednesday Take these medicines the morning of surgery with a SIP OF WATER: BP meds  Do not wear jewelry, make-up, or FINGER nail polish Do not wear lotions, powders, perfumes or deodorant. Do not shave 48 hours prior to surgery. Do not bring valuables to the hospital.  Leave suitcase in the car. After Surgery it may be brought to your room. For patients being admitted to the hospital, checkout time is 11:00am the day of discharge. Home with MOTHER Miranda [name] cell [phone]   Remember to use your hibiclens as instructed.Please shower with 1/2 bottle the evening before your surgery and the other 1/2 bottle the morning of surgery.

## 2011-10-22 ENCOUNTER — Encounter (HOSPITAL_COMMUNITY): Payer: Self-pay | Admitting: Obstetrics and Gynecology

## 2011-10-22 DIAGNOSIS — D251 Intramural leiomyoma of uterus: Secondary | ICD-10-CM

## 2011-10-22 HISTORY — DX: Intramural leiomyoma of uterus: D25.1

## 2011-10-22 NOTE — H&P (Signed)
Miranda [name] is an 36 y.o. female. G1P0 with fibroid uterus.  Uterus is causing dyspareunia, and abdominal fullness.  Pt states nl menses.  Desires definitive management, with ability to maintain fertlity.  D/W pt R/B/A - wishes to proceed.  Pertinent OB Gynecological History: G1P0010  G1 TAB No abnormal pap Known fibroid uterus, causing dyspareuniaHSV with annual outbreak Contraception OCP - Orthotricyclen No LMP recorded. regular, monthly   Past Medical History  Diagnosis Date  . Allergic rhinitis   . Acne   . Hypertension   . Hemorrhoids   . HA (headache) 07/2008    MRI (-), MRA 2 anerurysms?------> CT angio (-); saw Neuro, likely migraines, Rx imitrex  . Fibroids, intramural 10/22/2011    Past Surgical History  Procedure Date  . Wisdom tooth extraction   D&C  Family History  Problem Relation Age of Onset  . Coronary artery disease      GM in her 60s  . Diabetes      uncle  . Hypertension Mother     M, aunt, GM  . Leukemia      cousin  . Breast cancer      aunt  . Colon cancer Neg Hx   Leukemia  Social History:  reports that she has never smoked. She has never used smokeless tobacco. She reports that she drinks alcohol. She reports that she does not use illicit drugs. single, student in college and working collections  Allergies: No Known Allergies  Meds Orthotricyclen Valtrex Imitrex HTN med 'pril    Review of Systems  Constitutional: Negative.   HENT: Negative.   Eyes: Negative.   Respiratory: Negative.   Cardiovascular: Negative.   Gastrointestinal: Negative.   Genitourinary: Negative.   Musculoskeletal: Negative.   Skin: Negative.   Neurological: Negative.   Psychiatric/Behavioral: Negative.   AFVSS Physical Exam  Constitutional: She is oriented to person, place, and time. She appears well-developed and well-nourished.  HENT:  Head: Normocephalic and atraumatic.  Neck: Normal range of motion. Neck supple. No thyromegaly present.    Cardiovascular: Normal rate and regular rhythm.   Respiratory: Effort normal and breath sounds normal. No respiratory distress.  GI: Soft. Bowel sounds are normal. She exhibits no distension. There is no tenderness.  Genitourinary: Vagina normal and uterus normal.  Musculoskeletal: Normal range of motion.  Neurological: She is alert and oriented to person, place, and time.  Skin: Skin is warm and dry.  Psychiatric: She has a normal mood and affect. Her behavior is normal.    US reveal multiple fibroids 1-9 intramural; 7x8, 3x4, 3x4, 2x2, 3x3, 3x3, 6x5, 2x3, 2x3, 2x4, 2x4. Also others subserosal and submucosal/intramural; nl ovaries  Assessment/Plan: 35yo G1P0 with multiple symptomatic  Fibroids, for abdominal myomectomy,  D/W pt R/B/A wishes to proceed, myomectomy secondary to desire to maintain fertility.   BOVARD,Nashon Erbes 10/22/2011, 10:41 PM

## 2011-10-23 ENCOUNTER — Other Ambulatory Visit: Payer: Self-pay | Admitting: Obstetrics and Gynecology

## 2011-10-23 ENCOUNTER — Ambulatory Visit (HOSPITAL_COMMUNITY): Payer: BC Managed Care – PPO | Admitting: Anesthesiology

## 2011-10-23 ENCOUNTER — Encounter (HOSPITAL_COMMUNITY): Payer: Self-pay | Admitting: Anesthesiology

## 2011-10-23 ENCOUNTER — Inpatient Hospital Stay (HOSPITAL_COMMUNITY)
Admission: RE | Admit: 2011-10-23 | Discharge: 2011-10-25 | DRG: 359 | Disposition: A | Discharge status: Home / Self Care | Payer: BC Managed Care – PPO | Source: Ambulatory Visit | Attending: Obstetrics and Gynecology | Admitting: Obstetrics and Gynecology

## 2011-10-23 ENCOUNTER — Encounter (HOSPITAL_COMMUNITY): Payer: Self-pay | Admitting: *Deleted

## 2011-10-23 ENCOUNTER — Encounter (HOSPITAL_COMMUNITY)
Admission: RE | Disposition: A | Discharge status: Home / Self Care | Payer: Self-pay | Source: Ambulatory Visit | Attending: Obstetrics and Gynecology

## 2011-10-23 DIAGNOSIS — I10 Essential (primary) hypertension: Secondary | ICD-10-CM | POA: Diagnosis present

## 2011-10-23 DIAGNOSIS — D251 Intramural leiomyoma of uterus: Principal | ICD-10-CM | POA: Diagnosis present

## 2011-10-23 DIAGNOSIS — Z01812 Encounter for preprocedural laboratory examination: Secondary | ICD-10-CM

## 2011-10-23 DIAGNOSIS — IMO0002 Reserved for concepts with insufficient information to code with codable children: Secondary | ICD-10-CM | POA: Diagnosis present

## 2011-10-23 DIAGNOSIS — Z01818 Encounter for other preprocedural examination: Secondary | ICD-10-CM

## 2011-10-23 HISTORY — PX: LAPAROTOMY: SHX154

## 2011-10-23 HISTORY — DX: Intramural leiomyoma of uterus: D25.1

## 2011-10-23 SURGERY — LAPAROTOMY, EXPLORATORY
Anesthesia: General | Site: Abdomen | Wound class: Clean Contaminated

## 2011-10-23 MED ORDER — LACTATED RINGERS IV SOLN
INTRAVENOUS | Status: DC
  Administered 2011-10-23 (×6): via INTRAVENOUS

## 2011-10-23 MED ORDER — ROCURONIUM BROMIDE 100 MG/10ML IV SOLN
INTRAVENOUS | Status: DC | PRN
  Administered 2011-10-23: 20 mg via INTRAVENOUS
  Administered 2011-10-23: 40 mg via INTRAVENOUS
  Administered 2011-10-23: 20 mg via INTRAVENOUS

## 2011-10-23 MED ORDER — HYDROMORPHONE HCL PF 1 MG/ML IJ SOLN
0.2500 mg | INTRAMUSCULAR | Status: DC | PRN
  Administered 2011-10-23: 0.25 mg via INTRAVENOUS
  Administered 2011-10-23: 0.5 mg via INTRAVENOUS
  Administered 2011-10-23: 0.25 mg via INTRAVENOUS

## 2011-10-23 MED ORDER — AMOXICILLIN 500 MG PO CAPS
500.0000 mg | ORAL_CAPSULE | Freq: Two times a day (BID) | ORAL | Status: DC
  Administered 2011-10-23 – 2011-10-24 (×3): 500 mg via ORAL
  Filled 2011-10-23 (×7): qty 1

## 2011-10-23 MED ORDER — LIDOCAINE HCL (CARDIAC) 20 MG/ML IV SOLN
INTRAVENOUS | Status: DC | PRN
  Administered 2011-10-23: 60 mg via INTRAVENOUS

## 2011-10-23 MED ORDER — NEOSTIGMINE METHYLSULFATE 1 MG/ML IJ SOLN
INTRAMUSCULAR | Status: DC | PRN
  Administered 2011-10-23: 1.5 mg via INTRAVENOUS

## 2011-10-23 MED ORDER — KETOROLAC TROMETHAMINE 30 MG/ML IJ SOLN: 15.0000 mg | Freq: Once | INTRAMUSCULAR | Status: AC | PRN

## 2011-10-23 MED ORDER — ONDANSETRON HCL 4 MG/2ML IJ SOLN
INTRAMUSCULAR | Status: AC
  Filled 2011-10-23: qty 2

## 2011-10-23 MED ORDER — PROPOFOL 10 MG/ML IV EMUL
INTRAVENOUS | Status: AC
  Filled 2011-10-23: qty 20

## 2011-10-23 MED ORDER — TRIAMTERENE-HCTZ 37.5-25 MG PO TABS
1.0000 | ORAL_TABLET | Freq: Every day | ORAL | Status: DC
  Administered 2011-10-24: 1 via ORAL
  Filled 2011-10-23 (×4): qty 1

## 2011-10-23 MED ORDER — GLYCOPYRROLATE 0.2 MG/ML IJ SOLN
INTRAMUSCULAR | Status: DC | PRN
  Administered 2011-10-23: .6 mg via INTRAVENOUS

## 2011-10-23 MED ORDER — PROPOFOL 10 MG/ML IV EMUL
INTRAVENOUS | Status: DC | PRN
  Administered 2011-10-23: 150 mg via INTRAVENOUS

## 2011-10-23 MED ORDER — EPHEDRINE SULFATE 50 MG/ML IJ SOLN
INTRAMUSCULAR | Status: DC | PRN
  Administered 2011-10-23: 10 mg via INTRAVENOUS

## 2011-10-23 MED ORDER — ROCURONIUM BROMIDE 50 MG/5ML IV SOLN
INTRAVENOUS | Status: AC
  Filled 2011-10-23: qty 1

## 2011-10-23 MED ORDER — ONDANSETRON HCL 4 MG PO TABS: 4.0000 mg | ORAL_TABLET | Freq: Four times a day (QID) | ORAL | Status: DC | PRN

## 2011-10-23 MED ORDER — METHYLENE BLUE 1 % INJ SOLN
INTRAMUSCULAR | Status: AC
  Filled 2011-10-23: qty 1

## 2011-10-23 MED ORDER — MORPHINE SULFATE (PF) 1 MG/ML IV SOLN
INTRAVENOUS | Status: DC
  Administered 2011-10-23: 15:00:00 via INTRAVENOUS
  Administered 2011-10-23 – 2011-10-24 (×2): 3 mg via INTRAVENOUS
  Filled 2011-10-23: qty 25

## 2011-10-23 MED ORDER — ONDANSETRON HCL 4 MG/2ML IJ SOLN
4.0000 mg | Freq: Four times a day (QID) | INTRAMUSCULAR | Status: DC | PRN
  Filled 2011-10-23: qty 2

## 2011-10-23 MED ORDER — HYDROMORPHONE HCL PF 1 MG/ML IJ SOLN
INTRAMUSCULAR | Status: AC
  Administered 2011-10-23: 0.25 mg via INTRAVENOUS
  Filled 2011-10-23: qty 1

## 2011-10-23 MED ORDER — GLYCOPYRROLATE 0.2 MG/ML IJ SOLN
INTRAMUSCULAR | Status: AC
  Filled 2011-10-23: qty 2

## 2011-10-23 MED ORDER — FLUTICASONE PROPIONATE 50 MCG/ACT NA SUSP
1.0000 | Freq: Every day | NASAL | Status: DC
  Filled 2011-10-23: qty 16

## 2011-10-23 MED ORDER — FENTANYL CITRATE 0.05 MG/ML IJ SOLN
INTRAMUSCULAR | Status: AC
  Filled 2011-10-23: qty 5

## 2011-10-23 MED ORDER — LACTATED RINGERS IV SOLN
INTRAVENOUS | Status: DC
  Administered 2011-10-23 – 2011-10-24 (×2): via INTRAVENOUS

## 2011-10-23 MED ORDER — DEXAMETHASONE SODIUM PHOSPHATE 4 MG/ML IJ SOLN
INTRAMUSCULAR | Status: DC | PRN
  Administered 2011-10-23: 6 mg via INTRAVENOUS

## 2011-10-23 MED ORDER — STERILE WATER FOR IRRIGATION IR SOLN
Status: DC | PRN
  Administered 2011-10-23: 1000 mL

## 2011-10-23 MED ORDER — METHYLENE BLUE 1 % INJ SOLN: INTRAMUSCULAR | Status: DC | PRN

## 2011-10-23 MED ORDER — CEFAZOLIN SODIUM 1-5 GM-% IV SOLN
INTRAVENOUS | Status: AC
  Filled 2011-10-23: qty 50

## 2011-10-23 MED ORDER — VASOPRESSIN 20 UNIT/ML IJ SOLN
INTRAMUSCULAR | Status: AC
  Filled 2011-10-23: qty 1

## 2011-10-23 MED ORDER — ZOLPIDEM TARTRATE 5 MG PO TABS: 5.0000 mg | ORAL_TABLET | Freq: Every evening | ORAL | Status: DC | PRN

## 2011-10-23 MED ORDER — FENTANYL CITRATE 0.05 MG/ML IJ SOLN
INTRAMUSCULAR | Status: DC | PRN
  Administered 2011-10-23: 50 ug via INTRAVENOUS
  Administered 2011-10-23 (×2): 100 ug via INTRAVENOUS

## 2011-10-23 MED ORDER — MIDAZOLAM HCL 5 MG/5ML IJ SOLN
INTRAMUSCULAR | Status: DC | PRN
  Administered 2011-10-23: 2 mg via INTRAVENOUS

## 2011-10-23 MED ORDER — VASOPRESSIN 20 UNIT/ML IJ SOLN
INTRAVENOUS | Status: DC | PRN
  Administered 2011-10-23: 11:00:00 via INTRAMUSCULAR

## 2011-10-23 MED ORDER — NALOXONE HCL 0.4 MG/ML IJ SOLN: 0.4000 mg | INTRAMUSCULAR | Status: DC | PRN

## 2011-10-23 MED ORDER — MIDAZOLAM HCL 2 MG/2ML IJ SOLN
INTRAMUSCULAR | Status: AC
  Filled 2011-10-23: qty 2

## 2011-10-23 MED ORDER — SIMETHICONE 80 MG PO CHEW
80.0000 mg | CHEWABLE_TABLET | Freq: Four times a day (QID) | ORAL | Status: DC | PRN
  Administered 2011-10-24 (×2): 80 mg via ORAL

## 2011-10-23 MED ORDER — KETOROLAC TROMETHAMINE 30 MG/ML IJ SOLN
INTRAMUSCULAR | Status: DC | PRN
  Administered 2011-10-23: 30 mg via INTRAVENOUS

## 2011-10-23 MED ORDER — LIDOCAINE HCL (CARDIAC) 20 MG/ML IV SOLN
INTRAVENOUS | Status: AC
  Filled 2011-10-23: qty 5

## 2011-10-23 MED ORDER — CEFAZOLIN SODIUM 1-5 GM-% IV SOLN
1.0000 g | INTRAVENOUS | Status: AC
  Administered 2011-10-23: 1 g via INTRAVENOUS

## 2011-10-23 MED ORDER — ONDANSETRON HCL 4 MG/2ML IJ SOLN
INTRAMUSCULAR | Status: DC | PRN
  Administered 2011-10-23: 4 mg via INTRAVENOUS

## 2011-10-23 MED ORDER — DIPHENHYDRAMINE HCL 12.5 MG/5ML PO ELIX: 12.5000 mg | ORAL_SOLUTION | Freq: Four times a day (QID) | ORAL | Status: DC | PRN

## 2011-10-23 MED ORDER — ALUM & MAG HYDROXIDE-SIMETH 200-200-20 MG/5ML PO SUSP: 30.0000 mL | ORAL | Status: DC | PRN

## 2011-10-23 MED ORDER — DILTIAZEM HCL 90 MG PO TABS
45.0000 mg | ORAL_TABLET | Freq: Two times a day (BID) | ORAL | Status: DC
  Administered 2011-10-23 – 2011-10-24 (×2): 90 mg via ORAL
  Administered 2011-10-24: 45 mg via ORAL
  Filled 2011-10-23 (×7): qty 0.5

## 2011-10-23 MED ORDER — DOCUSATE SODIUM 100 MG PO CAPS
100.0000 mg | ORAL_CAPSULE | Freq: Two times a day (BID) | ORAL | Status: DC
  Administered 2011-10-23 – 2011-10-24 (×2): 100 mg via ORAL
  Filled 2011-10-23 (×2): qty 1

## 2011-10-23 MED ORDER — DEXAMETHASONE SODIUM PHOSPHATE 10 MG/ML IJ SOLN
INTRAMUSCULAR | Status: AC
  Filled 2011-10-23: qty 1

## 2011-10-23 MED ORDER — EPHEDRINE 5 MG/ML INJ
INTRAVENOUS | Status: AC
  Filled 2011-10-23: qty 10

## 2011-10-23 MED ORDER — DIPHENHYDRAMINE HCL 50 MG/ML IJ SOLN: 12.5000 mg | Freq: Four times a day (QID) | INTRAMUSCULAR | Status: DC | PRN

## 2011-10-23 MED ORDER — NEOSTIGMINE METHYLSULFATE 1 MG/ML IJ SOLN
INTRAMUSCULAR | Status: AC
  Filled 2011-10-23: qty 10

## 2011-10-23 MED ORDER — ONDANSETRON HCL 4 MG/2ML IJ SOLN: 4.0000 mg | Freq: Four times a day (QID) | INTRAMUSCULAR | Status: DC | PRN

## 2011-10-23 MED ORDER — MENTHOL 3 MG MT LOZG
1.0000 | LOZENGE | OROMUCOSAL | Status: DC | PRN
  Filled 2011-10-23: qty 9

## 2011-10-23 MED ORDER — SODIUM CHLORIDE 0.9 % IJ SOLN: 9.0000 mL | INTRAMUSCULAR | Status: DC | PRN

## 2011-10-23 SURGICAL SUPPLY — 46 items
BARRIER ADHS 3X4 INTERCEED (GAUZE/BANDAGES/DRESSINGS) ×2 IMPLANT
BENZOIN TINCTURE PRP APPL 2/3 (GAUZE/BANDAGES/DRESSINGS) ×2 IMPLANT
CANISTER SUCTION 2500CC (MISCELLANEOUS) ×2 IMPLANT
CATH FOLEY 3WAY  5CC 16FR (CATHETERS)
CATH FOLEY 3WAY 5CC 16FR (CATHETERS) IMPLANT
CHLORAPREP W/TINT 26ML (MISCELLANEOUS) ×2 IMPLANT
CLOSURE STERI STRIP 1/2 X4 (GAUZE/BANDAGES/DRESSINGS) ×2 IMPLANT
CLOTH BEACON ORANGE TIMEOUT ST (SAFETY) ×2 IMPLANT
CONTAINER PREFILL 10% NBF 15ML (MISCELLANEOUS) IMPLANT
DECANTER SPIKE VIAL GLASS SM (MISCELLANEOUS) ×2 IMPLANT
DRAIN CHANNEL 19F RND (DRAIN) IMPLANT
DRAPE CAMERA CLOSED 9X96 (DRAPES) IMPLANT
DRSG COVADERM 4X6 (GAUZE/BANDAGES/DRESSINGS) ×2 IMPLANT
DRSG VASELINE 3X18 (GAUZE/BANDAGES/DRESSINGS) IMPLANT
ELECT BLADE 6 FLAT ULTRCLN (ELECTRODE) IMPLANT
FILTER STRAW FLUID ASPIR (MISCELLANEOUS) ×2 IMPLANT
GAUZE SPONGE 4X4 12PLY STRL LF (GAUZE/BANDAGES/DRESSINGS) ×4 IMPLANT
GAUZE SPONGE 4X4 16PLY XRAY LF (GAUZE/BANDAGES/DRESSINGS) ×2 IMPLANT
GLOVE BIO SURGEON STRL SZ 6.5 (GLOVE) ×2 IMPLANT
GLOVE BIO SURGEON STRL SZ7 (GLOVE) ×2 IMPLANT
GOWN PREVENTION PLUS LG XLONG (DISPOSABLE) ×6 IMPLANT
NEEDLE HYPO 25X1 1.5 SAFETY (NEEDLE) IMPLANT
NS IRRIG 1000ML POUR BTL (IV SOLUTION) ×2 IMPLANT
PACK ABDOMINAL GYN (CUSTOM PROCEDURE TRAY) ×2 IMPLANT
PAD OB MATERNITY 4.3X12.25 (PERSONAL CARE ITEMS) ×2 IMPLANT
PLUG CATH AND CAP STER (CATHETERS) IMPLANT
SET CYSTO W/LG BORE CLAMP LF (SET/KITS/TRAYS/PACK) IMPLANT
SPONGE LAP 18X18 X RAY DECT (DISPOSABLE) ×4 IMPLANT
STAPLER VISISTAT 35W (STAPLE) IMPLANT
SUT PDS AB 0 CTX 60 (SUTURE) IMPLANT
SUT PROLENE 4 0 KS NEEDLE (SUTURE) IMPLANT
SUT VIC AB 0 CT1 27 (SUTURE) ×6
SUT VIC AB 0 CT1 27XBRD ANBCTR (SUTURE) ×6 IMPLANT
SUT VIC AB 2-0 CT1 (SUTURE) ×6 IMPLANT
SUT VIC AB 2-0 CT1 27 (SUTURE) ×2
SUT VIC AB 2-0 CT1 TAPERPNT 27 (SUTURE) ×2 IMPLANT
SUT VIC AB 3-0 CTX 36 (SUTURE) IMPLANT
SUT VIC AB 3-0 FS2 27 (SUTURE) ×2 IMPLANT
SUT VIC AB 3-0 SH 27 (SUTURE) ×3
SUT VIC AB 3-0 SH 27X BRD (SUTURE) ×3 IMPLANT
SUT VICRYL 0 TIES 12 18 (SUTURE) IMPLANT
SYR 3ML LL SCALE MARK (SYRINGE) ×2 IMPLANT
SYR CONTROL 10ML LL (SYRINGE) ×2 IMPLANT
TOWEL OR 17X24 6PK STRL BLUE (TOWEL DISPOSABLE) ×4 IMPLANT
TRAY FOLEY CATH 14FR (SET/KITS/TRAYS/PACK) ×2 IMPLANT
WATER STERILE IRR 1000ML POUR (IV SOLUTION) IMPLANT

## 2011-10-23 NOTE — Interval H&P Note (Signed)
History and Physical Interval Note:  10/23/2011 9:42 AM  Miranda [name]  has presented today for surgery, with the diagnosis of fibroids of uterus  The various methods of treatment have been discussed with the patient and family. After consideration of risks, benefits and other options for treatment, the patient has consented to  Procedure(s): EXPLORATORY LAPAROTOMY as a surgical intervention .  The patients' history has been reviewed, patient examined, no change in status, stable for surgery.  I have reviewed the patients' chart and labs.  Questions were answered to the patient's satisfaction.     [name],Miranda Provencal  Reviewed H&P bo changes.  Reviewed procedure including R/B/A.  Will proceed

## 2011-10-23 NOTE — Brief Op Note (Signed)
10/23/2011  12:38 PM  PATIENT:  Miranda [name]  36 y.o. female  PRE-OPERATIVE DIAGNOSIS:  fibroids of uterus  POST-OPERATIVE DIAGNOSIS:  fibroids of uterus  PROCEDURE:  Procedure(s): EXPLORATORY LAPAROTOMY, myomectomy  SURGEON:  Surgeon(s): Sherron Monday, MD Zenaida Niece, MD   ANESTHESIA:   general  EBL:  Total I/O In: 3800 [I.V.:3800] Out: 800 [Urine:300; Blood:500]  BLOOD ADMINISTERED:none  DRAINS: none   LOCAL MEDICATIONS USED:  NONE  SPECIMEN:  Source of Specimen:  fibroids  DISPOSITION OF SPECIMEN:  PATHOLOGY  COUNTS:  YES  DICTATION: .Other Dictation: Dictation Number [id number]  PLAN OF CARE: Admit to inpatient   PATIENT DISPOSITION:  PACU - hemodynamically stable.   Delay start of Pharmacological VTE agent (>24hrs) due to surgical blood loss or risk of bleeding:  {YES/NO/NOT APPLICABLE:20182

## 2011-10-23 NOTE — Anesthesia Procedure Notes (Signed)
Procedure Name: Intubation Date/Time: 10/23/2011 10:07 AM Performed by: Carlyle Lipa Pre-anesthesia Checklist: Patient identified, Emergency Drugs available, Suction available, Patient being monitored and Timeout performed Patient Re-evaluated:Patient Re-evaluated prior to inductionOxygen Delivery Method: Circle System Utilized Preoxygenation: Pre-oxygenation with 100% oxygen Intubation Type: IV induction Ventilation: Mask ventilation without difficulty Laryngoscope Size: Miller and 2 Grade View: Grade I Tube type: Oral Number of attempts: 1 Airway Equipment and Method: stylet Placement Confirmation: ETT inserted through vocal cords under direct vision,  positive ETCO2 and breath sounds checked- equal and bilateral Secured at: 21 cm Tube secured with: Tape Dental Injury: Teeth and Oropharynx as per pre-operative assessment

## 2011-10-23 NOTE — Progress Notes (Signed)
Patient ID: Miranda [name], female   DOB: 1976-01-28, 36 y.o.   MRN: [id number] DOS VS stable pt awake and alert.Output good.

## 2011-10-23 NOTE — Anesthesia Postprocedure Evaluation (Signed)
Anesthesia Post Note  Patient: Miranda [name]  Procedure(s) Performed:  EXPLORATORY LAPAROTOMY - thru incision (laparotomy)/Myomyectomy  Anesthesia type: General  Patient location: PACU  Post pain: Pain level controlled  Post assessment: Post-op Vital signs reviewed  Last Vitals:  Filed Vitals:   10/23/11 1315  BP: 115/64  Pulse: 86  Temp:   Resp: 16    Post vital signs: Reviewed  Level of consciousness: sedated  Complications: No apparent anesthesia complications

## 2011-10-23 NOTE — Anesthesia Preprocedure Evaluation (Signed)
Anesthesia Evaluation  Patient identified by MRN, date of birth, ID band Patient awake    Reviewed: Allergy & Precautions, H&P , NPO status , Patient's Chart, lab work & pertinent test results, reviewed documented beta blocker date and time   History of Anesthesia Complications Negative for: history of anesthetic complications  Airway Mallampati: I TM Distance: >3 FB Neck ROM: full    Dental  (+) Teeth Intact   Pulmonary neg pulmonary ROS,  clear to auscultation  Pulmonary exam normal       Cardiovascular Exercise Tolerance: Good hypertension (144/99 today - relates to nerves, took maxide and cardizem today), On Medications regular Normal    Neuro/Psych  Headaches (migraines about once a month), Negative Psych ROS   GI/Hepatic negative GI ROS, Neg liver ROS,   Endo/Other  Negative Endocrine ROS  Renal/GU negative Renal ROS  Genitourinary negative   Musculoskeletal   Abdominal   Peds  Hematology negative hematology ROS (+)   Anesthesia Other Findings   Reproductive/Obstetrics negative OB ROS                           Anesthesia Physical Anesthesia Plan  ASA: II  Anesthesia Plan: General ETT   Post-op Pain Management:    Induction:   Airway Management Planned:   Additional Equipment:   Intra-op Plan:   Post-operative Plan:   Informed Consent: I have reviewed the patients History and Physical, chart, labs and discussed the procedure including the risks, benefits and alternatives for the proposed anesthesia with the patient or authorized representative who has indicated his/her understanding and acceptance.   Dental Advisory Given  Plan Discussed with: CRNA and Surgeon  Anesthesia Plan Comments:         Anesthesia Quick Evaluation

## 2011-10-23 NOTE — Transfer of Care (Signed)
Immediate Anesthesia Transfer of Care Note  Patient: Miranda [name]  Procedure(s) Performed:  EXPLORATORY LAPAROTOMY - thru incision (laparotomy)/Myomyectomy  Patient Location: PACU  Anesthesia Type: General  Level of Consciousness: awake, alert  and oriented  Airway & Oxygen Therapy: Patient Spontanous Breathing and Patient connected to nasal cannula oxygen  Post-op Assessment: Report given to PACU RN and Post -op Vital signs reviewed and stable  Post vital signs: Reviewed and stable  Complications: No apparent anesthesia complications

## 2011-10-24 LAB — CBC
Hemoglobin: 9 g/dL — ABNORMAL LOW (ref 12.0–15.0)
MCH: 28.2 pg (ref 26.0–34.0)
MCV: 86.2 fL (ref 78.0–100.0)
Platelets: 253 10*3/uL (ref 150–400)
RBC: 3.19 MIL/uL — ABNORMAL LOW (ref 3.87–5.11)
WBC: 15.3 10*3/uL — ABNORMAL HIGH (ref 4.0–10.5)

## 2011-10-24 LAB — BASIC METABOLIC PANEL
CO2: 26 mEq/L (ref 19–32)
Calcium: 8.3 mg/dL — ABNORMAL LOW (ref 8.4–10.5)
GFR calc non Af Amer: >90 mL/min (ref >90)
Glucose, Bld: 144 mg/dL — ABNORMAL HIGH (ref 70–99)
Potassium: 3.8 mEq/L (ref 3.5–5.1)
Sodium: 137 mEq/L (ref 135–145)

## 2011-10-24 MED ORDER — NALOXONE HCL 0.4 MG/ML IJ SOLN: 0.4000 mg | INTRAMUSCULAR | Status: DC | PRN

## 2011-10-24 MED ORDER — OXYCODONE-ACETAMINOPHEN 5-325 MG PO TABS
1.0000 | ORAL_TABLET | ORAL | Status: DC | PRN
  Administered 2011-10-24 (×3): 1 via ORAL
  Filled 2011-10-24 (×3): qty 1

## 2011-10-24 MED ORDER — ONDANSETRON HCL 4 MG/2ML IJ SOLN: 4.0000 mg | Freq: Four times a day (QID) | INTRAMUSCULAR | Status: DC | PRN

## 2011-10-24 MED ORDER — SODIUM CHLORIDE 0.9 % IJ SOLN: 9.0000 mL | INTRAMUSCULAR | Status: DC | PRN

## 2011-10-24 MED ORDER — HYDROMORPHONE 0.3 MG/ML IV SOLN
INTRAVENOUS | Status: DC
  Administered 2011-10-24: 0.2 mg via INTRAVENOUS
  Administered 2011-10-24: 0.599 mg via INTRAVENOUS
  Administered 2011-10-24: 01:00:00 via INTRAVENOUS

## 2011-10-24 MED ORDER — HYDROMORPHONE 0.3 MG/ML IV SOLN
INTRAVENOUS | Status: AC
  Filled 2011-10-24: qty 25

## 2011-10-24 MED ORDER — DIPHENHYDRAMINE HCL 12.5 MG/5ML PO ELIX: 12.5000 mg | ORAL_SOLUTION | Freq: Four times a day (QID) | ORAL | Status: DC | PRN

## 2011-10-24 MED ORDER — DIPHENHYDRAMINE HCL 50 MG/ML IJ SOLN: 12.5000 mg | Freq: Four times a day (QID) | INTRAMUSCULAR | Status: DC | PRN

## 2011-10-24 NOTE — Anesthesia Postprocedure Evaluation (Signed)
  Anesthesia Post-op Note  Patient: Miranda [name]  Procedure(s) Performed:  EXPLORATORY LAPAROTOMY - thru incision (laparotomy)/Myomyectomy  Patient Location: PACU and Mother/Baby  Anesthesia Type: General  Level of Consciousness: awake, alert  and oriented  Airway and Oxygen Therapy: Patient Spontanous Breathing  Post-op Pain: none  Post-op Assessment: Post-op Vital signs reviewed  Post-op Vital Signs: Reviewed and stable  Complications: No apparent anesthesia complications

## 2011-10-24 NOTE — Op Note (Signed)
Miranda [name], Miranda [name]                ACCOUNT NO.:  [id number]  MEDICAL RECORD NO.:  [id number]  LOCATION:  9309                          FACILITY:  [facility]  PHYSICIAN:  Sherron Monday, MD        DATE OF BIRTH:  November 06, 1975  DATE OF PROCEDURE:  10/23/2011 DATE OF DISCHARGE:                              OPERATIVE REPORT   PREOPERATIVE DIAGNOSIS:  Fibroid uterus.  POSTOPERATIVE DIAGNOSIS:  Fibroid uterus.  PROCEDURE:  Abdominal myomectomy.  SURGEON:  Sherron Monday, MD.  ASSISTED BY:  Zenaida Niece, MD.  ANESTHESIA:  General.  ESTIMATED BLOOD LOSS:  500 mL.  URINE OUTPUT:  300 mL, clear urine at the end of the procedure.  IV FLUIDS:  3800.  FINDINGS:  Multiple large fibroids, uterus.  Otherwise, normal tubes and ovaries.  COMPLICATIONS:  None.  PATHOLOGY:  Fibroids to Pathology.  DISPOSITION:  Stable to the PACU following procedure.  PROCEDURE:  After informed consent was reviewed with the patient including risks, benefits, and alternatives, she was transferred to the OR in stable condition, placed on the table in supine position.  General anesthesia was induced and found to be adequate.  She was then prepped and draped in the normal sterile fashion.  A Foley catheter was sterilely placed.  Pfannenstiel skin incision was made at the level of 2 fingerbreadths above the pubic symphysis, carried through the underlying layer of fascia sharply.  Fascia was incised in the midline and the incision was extended with Mayo scissors.  The inferior aspect of fascial incision was grasped with Kocher clamps, elevated, and the rectus muscles were dissected off both bluntly and sharply.  Attention was turned to the superior portion which in a similar fashion was grasped with Kocher clamps, elevated, and the rectus muscles were dissected off both bluntly and sharply.  Midline was easily identified. Peritoneum was entered bluntly.  Incision was extended superiorly and inferiorly with good  visualization of the bladder.  The uterus was delivered with some difficulty from the incision.  Of note, 2 very large, softball-sized fibroids at the cornu/fundus and multiple other in the body of the uterus.  A Pitressin was injected and the uterus was incised and the fibroids were delivered from this incision with blunt dissection, Bovie cautery, sharp dissection and they were being grasped with a towel clamp.  This incision was extended to reach more fibroids.  There were several also that were pedunculated from the surface that were removed.  The sites were made hemostatic, closed in layers with 2-0 Vicryl, 3-0 Vicryl, and a baseball stitch for the first layer.  The irrigation was performed.  Interceed was applied.  The patient tolerated the procedure well.  Sponge, lap, and needle count was correct x2 per the operating room staff.  She was awakened in stable condition and transferred to the PACU.     Sherron Monday, MD     JB/MEDQ  D:  10/23/2011  T:  10/24/2011  Job:  [id number]

## 2011-10-24 NOTE — Progress Notes (Signed)
1 Day Post-Op Procedure(s): EXPLORATORY LAPAROTOMY, Myomectomy  Subjective: Patient reports nausea, incisional pain and tolerating PO.  Pain controlled, has ambulated  Objective: I have reviewed patient's vital signs, intake and output and labs.  General: alert and no distress Resp: clear to auscultation bilaterally Cardio: regular rate and rhythm GI: soft, non-tender; bowel sounds normal; no masses,  no organomegaly and incision: bandage intact Extremities: extremities normal, atraumatic, no cyanosis or edema  Assessment: s/p Procedure(s): EXPLORATORY LAPAROTOMY/myomectomy: stable  Plan: Advance diet Encourage ambulation Advance to PO medication Discontinue IV fluids, d/c in AM  LOS: 1 day    [name],Miranda Baranowski 10/24/2011, 8:59 AM

## 2011-10-24 NOTE — Addendum Note (Signed)
Addendum  created 10/24/11 0747 by Isabella Bowens, CRNA   Modules edited:Notes Section

## 2011-10-25 MED ORDER — HYDROCODONE-ACETAMINOPHEN 5-325 MG PO TABS: 1.0000 | ORAL_TABLET | Freq: Four times a day (QID) | ORAL | Status: AC | PRN

## 2011-10-25 MED ORDER — HYDROCODONE-ACETAMINOPHEN 5-325 MG PO TABS
1.0000 | ORAL_TABLET | Freq: Four times a day (QID) | ORAL | Status: DC | PRN
  Administered 2011-10-25: 1 via ORAL
  Filled 2011-10-25: qty 1

## 2011-10-25 NOTE — Discharge Summary (Signed)
Physician Discharge Summary  Patient ID: SAMYAH BILBO MRN: [id number] DOB/AGE: 03-31-1976 35 y.o.  Admit date: 10/23/2011 Discharge date: 10/25/2011  Admission Diagnoses:Fibroids  Discharge Diagnoses:  Principal Problem:  *Fibroids, intramural S/p abdominal myomectomy  Discharged Condition: good  Hospital Course: Pt admitted for post-operative care following an abdominal myomectomy.  She did well and by post-op day #2 was tolerating a regular diet, voiding well and passing flatus.  Her incision was well-approximated with no erythema.  Discharge Exam: Blood pressure 130/83, pulse 93, temperature 99 F (37.2 C), temperature source Oral, resp. rate 18, height 5\' 6"  (1.676 m), weight 79.379 kg (175 lb), SpO2 100.00%. General appearance: alert Resp: clear to auscultation bilaterally Cardio: regular rate and rhythm GI: soft and NT with incision well-approximated  Disposition:home  Medication List  As of 10/25/2011 10:09 AM   ASK your doctor about these medications         amoxicillin 500 MG tablet   Commonly known as: AMOXIL   Take 500 mg by mouth 2 (two) times daily.      diltiazem 90 MG tablet   Commonly known as: CARDIZEM   Take 0.5 tablets (45 mg total) by mouth 2 (two) times daily.      metoCLOPramide 10 MG tablet   Commonly known as: REGLAN   Take 10 mg by mouth as needed. When has migraine      mometasone 50 MCG/ACT nasal spray   Commonly known as: NASONEX   Place 2 sprays into the nose daily.      norgestimate-ethinyl estradiol 0.25-35 MG-MCG tablet   Commonly known as: ORTHO-CYCLEN,SPRINTEC,PREVIFEM   Take 1 tablet by mouth daily.      SUMAtriptan 50 MG tablet   Commonly known as: IMITREX   1 tab by mouth w/ onset of HA; may repeat in 2 hours in HA worse. No More than 2 tabs a day.      triamterene-hydrochlorothiazide 37.5-25 MG per tablet   Commonly known as: MAXZIDE-25   TAKE 1 TABLET BY MOUTH ONCE DAILY - DUE OFFICE VISIT FOR ADDITIONAL REFILLS           Follow-up Information    Follow up with BOVARD,JODY, MD. Schedule an appointment as soon as possible for a visit in 2 weeks.   Contact information:   [address] [facility] [address] [place] [address] [phone]          Signed: Oliver Pila 10/25/2011, 10:09 AM

## 2011-10-25 NOTE — Progress Notes (Signed)
2 Days Post-Op Procedure(s): EXPLORATORY LAPAROTOMY  Subjective: Patient reports feels better, passing flatus and tolerating po well.  No problems voiding.  Percocet made her nauseous so changed to vicodin and tolerating better. Objective: I have reviewed patient's vital signs, intake and output and medications.  General: alert Cardio: regular rate and rhythm GI: soft and NT with incision well-approximated with steristrips  Assessment: s/p Procedure(s): EXPLORATORY LAPAROTOMY: progressing well and tolerating diet  Plan: Discharge home Motrin and vicodin Fu 2 weeks with Dr. Ellyn Hack  LOS: 2 days    Oliver Pila 10/25/2011, 10:06 AM

## 2011-10-27 ENCOUNTER — Encounter (HOSPITAL_COMMUNITY): Payer: Self-pay | Admitting: Obstetrics and Gynecology

## 2011-12-05 ENCOUNTER — Other Ambulatory Visit: Payer: Self-pay | Admitting: Internal Medicine

## 2011-12-08 NOTE — Telephone Encounter (Signed)
Prescription sent to pharmacy.  No refills given.  Patient due for annual physical 12/2011.

## 2011-12-09 ENCOUNTER — Other Ambulatory Visit: Payer: Self-pay | Admitting: Internal Medicine

## 2012-03-01 ENCOUNTER — Other Ambulatory Visit: Payer: Self-pay | Admitting: Internal Medicine

## 2012-03-01 NOTE — Telephone Encounter (Signed)
Pt has not been seen by you within a year. OK to refill? 

## 2012-03-05 NOTE — Telephone Encounter (Signed)
Pt has a future appt. Refilled for 1 month

## 2012-03-15 ENCOUNTER — Encounter: Payer: BC Managed Care – PPO | Admitting: Internal Medicine

## 2012-03-24 ENCOUNTER — Encounter: Payer: Self-pay | Admitting: Internal Medicine

## 2012-03-24 ENCOUNTER — Ambulatory Visit (INDEPENDENT_AMBULATORY_CARE_PROVIDER_SITE_OTHER): Payer: BC Managed Care – PPO | Admitting: Internal Medicine

## 2012-03-24 VITALS — BP 110/78 | HR 106 | Temp 98.2°F | Resp 16 | Ht 66.75 in | Wt 179.0 lb

## 2012-03-24 DIAGNOSIS — I10 Essential (primary) hypertension: Secondary | ICD-10-CM

## 2012-03-24 DIAGNOSIS — Z0000 Encounter for general adult medical examination without abnormal findings: Secondary | ICD-10-CM

## 2012-03-24 LAB — LIPID PANEL
Cholesterol: 172 mg/dL (ref 0–200)
HDL: 52.1 mg/dL (ref >39.00)

## 2012-03-24 LAB — CBC WITH DIFFERENTIAL/PLATELET
Basophils Absolute: 0 10*3/uL (ref 0.0–0.1)
Eosinophils Absolute: 0 10*3/uL (ref 0.0–0.7)
Hemoglobin: 13.7 g/dL (ref 12.0–15.0)
Lymphocytes Relative: 32.8 % (ref 12.0–46.0)
MCHC: 32.5 g/dL (ref 30.0–36.0)
Neutro Abs: 3.5 10*3/uL (ref 1.4–7.7)
Platelets: 331 10*3/uL (ref 150.0–400.0)
RDW: 15.6 % — ABNORMAL HIGH (ref 11.5–14.6)

## 2012-03-24 LAB — COMPREHENSIVE METABOLIC PANEL
ALT: 18 U/L (ref 0–35)
AST: 19 U/L (ref 0–37)
Albumin: 4 g/dL (ref 3.5–5.2)
CO2: 27 mEq/L (ref 19–32)
Calcium: 9.4 mg/dL (ref 8.4–10.5)
Chloride: 102 mEq/L (ref 96–112)
Creatinine, Ser: 0.8 mg/dL (ref 0.4–1.2)
Potassium: 3.9 mEq/L (ref 3.5–5.1)
Sodium: 138 mEq/L (ref 135–145)
Total Protein: 8 g/dL (ref 6.0–8.3)

## 2012-03-24 LAB — TSH: TSH: 1.05 u[IU]/mL (ref 0.35–5.50)

## 2012-03-24 MED ORDER — TRIAMTERENE-HCTZ 37.5-25 MG PO TABS: 1.0000 | ORAL_TABLET | Freq: Every day | ORAL | Status: DC

## 2012-03-24 MED ORDER — DILTIAZEM HCL 90 MG PO TABS: 45.0000 mg | ORAL_TABLET | Freq: Two times a day (BID) | ORAL | Status: DC

## 2012-03-24 MED ORDER — MOMETASONE FUROATE 50 MCG/ACT NA SUSP: 2.0000 | Freq: Every day | NASAL | Status: DC

## 2012-03-24 NOTE — Progress Notes (Signed)
  Subjective:    Patient ID: Miranda [name], female    DOB: 1976/06/05, 36 y.o.   MRN: [id number]  HPI CPX  Past Medical History  Diagnosis Date  . Allergic rhinitis   . Acne   . Hypertension   . Hemorrhoids   . HA (headache) 07/2008    MRI (-), MRA 2 anerurysms?------> CT angio (-); saw Neuro, likely migraines, Rx imitrex  . Fibroids, intramural 10/22/2011   Past Surgical History  Procedure Date  . Wisdom tooth extraction   . Laparotomy 10/23/2011    Procedure: EXPLORATORY LAPAROTOMY;  Surgeon: Sherron Monday, MD;  Location: [facility];  Service: Gynecology;  Laterality: N/A;  thru incision (laparotomy)/Myomyectomy   History   Social History  . Marital Status: Single    Spouse Name: N/A    Number of Children: 0  . Years of Education: N/A   Occupational History  . [employer]    Social History Main Topics  . Smoking status: Never Smoker   . Smokeless tobacco: Never Used  . Alcohol Use: Yes     socially  . Drug Use: No  . Sexually Active: Yes    Birth Control/ Protection: Pill   Other Topics Concern  . Not on file   Social History Narrative   Exercise, diet-- not very healthy, needs improvement, live by herself    Family History  Problem Relation Age of Onset  . Coronary artery disease      GM in her 60s  . Diabetes      uncle  . Hypertension      M, aunt, GM  . Leukemia      cousin  . Breast cancer      aunt  . Colon cancer Neg Hx     Review of Systems Chest pain or shortness or breath No nausea, vomiting or diarrhea. No dysuria or gross hematuria. Periods are  regular, on BCP No anxiety depression.     Objective:   Physical Exam  General -- alert, well-developed Neck --no thyromegaly Lungs -- normal respiratory effort, no intercostal retractions, no accessory muscle use, and normal breath sounds.   Heart-- normal rate, regular rhythm, no murmur, and no gallop.   Abdomen--soft, non-tender, no distention, no masses, no HSM, no guarding, and no  rigidity.   Extremities-- no pretibial edema bilaterally Neurologic-- alert & oriented X3 and strength normal in all extremities. Psych-- Cognition and judgment appear intact. Alert and cooperative with normal attention span and concentration.  not anxious appearing and not depressed appearing.       Assessment & Plan:

## 2012-03-24 NOTE — Assessment & Plan Note (Addendum)
Td 08 Diet-exercise discussed  Labs Sees gyn regularly

## 2012-03-24 NOTE — Assessment & Plan Note (Signed)
No change 

## 2012-03-29 ENCOUNTER — Encounter: Payer: Self-pay | Admitting: *Deleted

## 2012-06-02 ENCOUNTER — Other Ambulatory Visit: Payer: Self-pay | Admitting: Internal Medicine

## 2012-06-02 NOTE — Telephone Encounter (Signed)
Spoke with pharmacy. Pt still has refills left on file.

## 2012-07-09 ENCOUNTER — Other Ambulatory Visit: Payer: Self-pay | Admitting: Internal Medicine

## 2012-07-09 ENCOUNTER — Encounter: Payer: Self-pay | Admitting: *Deleted

## 2012-07-09 ENCOUNTER — Ambulatory Visit (INDEPENDENT_AMBULATORY_CARE_PROVIDER_SITE_OTHER): Payer: BC Managed Care – PPO | Admitting: Internal Medicine

## 2012-07-09 VITALS — BP 112/74 | HR 80 | Temp 98.4°F | Wt 178.0 lb

## 2012-07-09 DIAGNOSIS — R42 Dizziness and giddiness: Secondary | ICD-10-CM

## 2012-07-09 DIAGNOSIS — J40 Bronchitis, not specified as acute or chronic: Secondary | ICD-10-CM

## 2012-07-09 LAB — CBC WITH DIFFERENTIAL/PLATELET
Eosinophils Relative: 1.1 % (ref 0.0–5.0)
HCT: 41.6 % (ref 36.0–46.0)
Hemoglobin: 13.3 g/dL (ref 12.0–15.0)
Lymphs Abs: 2.3 10*3/uL (ref 0.7–4.0)
Monocytes Relative: 11.5 % (ref 3.0–12.0)
Neutro Abs: 4.7 10*3/uL (ref 1.4–7.7)
RDW: 13.7 % (ref 11.5–14.6)
WBC: 8.1 10*3/uL (ref 4.5–10.5)

## 2012-07-09 MED ORDER — AZELASTINE HCL 0.1 % NA SOLN: 2.0000 | Freq: Two times a day (BID) | NASAL | Status: DC

## 2012-07-09 MED ORDER — AZITHROMYCIN 250 MG PO TABS: ORAL_TABLET | ORAL | Status: DC

## 2012-07-09 NOTE — Telephone Encounter (Signed)
Miranda [name] @ pharmacy confirmed pt still has refills left on file. Refill request sent in error.

## 2012-07-09 NOTE — Patient Instructions (Addendum)
Rest, fluids , tylenol For cough, take Mucinex DM twice a day as needed  For congestion use astelin nasal spray twice a day until you feel better Take the antibiotic as prescribed  (zithromax) Call if no better in few days Call anytime if the symptoms are severe ------ If you continue having episodes of lightheadedness or if you develop chest pain, shortness of breath ----> please call us right away.

## 2012-07-09 NOTE — Progress Notes (Signed)
  Subjective:    Patient ID: Miranda [name], female    DOB: 13-Jul-1976, 36 y.o.   MRN: [id number]  HPI Acute visit 6 days ago, she was in [place] tailgating, suddenly she felt lightheaded,  had to sit down for 30 minutes. After that she felt better. She was not drinking alcohol at the time but the previous night she did (in moderation). Similar symptoms 3 days ago. Also 3 days ago started to develop a cold: Cough, chest congestion, poor appetite. Did have some cough with greenish sputum, one time saw traces of blood in the green sputum. + green nasal d/c  Past Medical History  Diagnosis Date  . Allergic rhinitis   . Acne   . Hypertension   . Hemorrhoids   . HA (headache) 07/2008    MRI (-), MRA 2 anerurysms?------> CT angio (-); saw Neuro, likely migraines, Rx imitrex  . Fibroids, intramural 10/22/2011   Past Surgical History  Procedure Date  . Wisdom tooth extraction   . Laparotomy 10/23/2011    Procedure: EXPLORATORY LAPAROTOMY;  Surgeon: Sherron Monday, MD;  Location: [facility];  Service: Gynecology;  Laterality: N/A;  thru incision (laparotomy)/Myomyectomy     Review of Systems Denies chest pain or shortness of breath No nausea, vomiting diarrhea. No headaches. Denies diplopia or slurred speech. No facial numbness. She did dribe back and forward to [place], denies lower extremity edema, calves pain. Good medication compliance w/ BP meds    Objective:   Physical Exam General -- alert, well-developed  HEENT -- TMs normal, throat w/o redness, face symmetric and not tender to palpation, nose quite congested  Lungs -- normal respiratory effort, no intercostal retractions, no accessory muscle use, and normal breath sounds.   Heart-- normal rate, regular rhythm, no murmur, and no gallop.   Extremities-- no pretibial edema bilaterally; calves symmetric  Neurologic-- alert & oriented X3 ; speech, gait normal, face symmetric. strength normal in all extremities.  EOMI-PERRLA Psych-- Cognition and judgment appear intact. Alert and cooperative with normal attention span and concentration.  not anxious appearing and not depressed appearing.       Assessment & Plan:   1. Mild bronchitis, see instructions exam  2. Lightheadedness, 2 episodes of lightheadedness  last week, although she did travel to [place], she denies chest pain, shortness of breath, calves swelling or other symptoms that point to a serious condition  like a PE. She did cough up green sputum with a tinge of blood in the setting of bronchitis. I'll recommend to check a CBC to be sure she doesn't have anemia, call or go to ER if she continued having lightheadedness or has a severe episode. Patient in agreement.

## 2012-07-12 ENCOUNTER — Encounter: Payer: Self-pay | Admitting: Internal Medicine

## 2012-11-14 ENCOUNTER — Other Ambulatory Visit: Payer: Self-pay

## 2012-12-07 ENCOUNTER — Telehealth: Payer: Self-pay | Admitting: *Deleted

## 2012-12-07 NOTE — Telephone Encounter (Signed)
Left msg on triage wanting to get copy of immunization records & TB test. Msg was left on [facility] triage vm routing msg to [email or web address]...Raechel Chute

## 2012-12-07 NOTE — Telephone Encounter (Signed)
Pt will pick up at pending OV.

## 2012-12-07 NOTE — Telephone Encounter (Signed)
Please do

## 2012-12-14 ENCOUNTER — Ambulatory Visit: Payer: BC Managed Care – PPO

## 2012-12-14 ENCOUNTER — Ambulatory Visit (INDEPENDENT_AMBULATORY_CARE_PROVIDER_SITE_OTHER): Payer: No Typology Code available for payment source | Admitting: *Deleted

## 2012-12-16 ENCOUNTER — Encounter: Payer: Self-pay | Admitting: *Deleted

## 2013-01-28 ENCOUNTER — Other Ambulatory Visit: Payer: Self-pay | Admitting: *Deleted

## 2013-01-28 MED ORDER — TRIAMTERENE-HCTZ 37.5-25 MG PO TABS: 1.0000 | ORAL_TABLET | Freq: Every day | ORAL | Status: DC

## 2013-01-28 NOTE — Telephone Encounter (Signed)
Rx sent 

## 2013-03-23 ENCOUNTER — Other Ambulatory Visit: Payer: Self-pay | Admitting: *Deleted

## 2013-03-23 MED ORDER — DILTIAZEM HCL 90 MG PO TABS: 45.0000 mg | ORAL_TABLET | Freq: Two times a day (BID) | ORAL | Status: DC

## 2013-03-23 NOTE — Telephone Encounter (Signed)
Rx sent 

## 2013-05-05 ENCOUNTER — Ambulatory Visit (INDEPENDENT_AMBULATORY_CARE_PROVIDER_SITE_OTHER): Payer: No Typology Code available for payment source | Admitting: Internal Medicine

## 2013-05-05 ENCOUNTER — Encounter: Payer: Self-pay | Admitting: Internal Medicine

## 2013-05-05 VITALS — BP 130/88 | HR 82 | Ht 66.5 in | Wt 181.4 lb

## 2013-05-05 DIAGNOSIS — Z0000 Encounter for general adult medical examination without abnormal findings: Secondary | ICD-10-CM

## 2013-05-05 DIAGNOSIS — I10 Essential (primary) hypertension: Secondary | ICD-10-CM

## 2013-05-05 DIAGNOSIS — R51 Headache: Secondary | ICD-10-CM

## 2013-05-05 LAB — CBC WITH DIFFERENTIAL/PLATELET
Basophils Relative: 0.6 % (ref 0.0–3.0)
Eosinophils Absolute: 0 10*3/uL (ref 0.0–0.7)
Eosinophils Relative: 0.4 % (ref 0.0–5.0)
HCT: 41.5 % (ref 36.0–46.0)
Lymphs Abs: 1.8 10*3/uL (ref 0.7–4.0)
MCHC: 32.5 g/dL (ref 30.0–36.0)
MCV: 86.9 fl (ref 78.0–100.0)
Monocytes Absolute: 0.5 10*3/uL (ref 0.1–1.0)
Neutrophils Relative %: 56.7 % (ref 43.0–77.0)
Platelets: 388 10*3/uL (ref 150.0–400.0)
RBC: 4.77 Mil/uL (ref 3.87–5.11)
WBC: 5.3 10*3/uL (ref 4.5–10.5)

## 2013-05-05 LAB — COMPREHENSIVE METABOLIC PANEL
Albumin: 3.8 g/dL (ref 3.5–5.2)
BUN: 15 mg/dL (ref 6–23)
Calcium: 9.3 mg/dL (ref 8.4–10.5)
Chloride: 102 mEq/L (ref 96–112)
Glucose, Bld: 78 mg/dL (ref 70–99)
Potassium: 3.4 mEq/L — ABNORMAL LOW (ref 3.5–5.1)

## 2013-05-05 LAB — URINALYSIS, ROUTINE W REFLEX MICROSCOPIC
Ketones, ur: NEGATIVE
Nitrite: NEGATIVE
Specific Gravity, Urine: 1.015 (ref 1.000–1.030)
Urine Glucose: NEGATIVE
Urobilinogen, UA: 0.2 (ref 0.0–1.0)
WBC, UA: NONE SEEN (ref 0–?)
pH: 7.5 (ref 5.0–8.0)

## 2013-05-05 LAB — LIPID PANEL
Cholesterol: 167 mg/dL (ref 0–200)
Triglycerides: 74 mg/dL (ref 0.0–149.0)

## 2013-05-05 NOTE — Patient Instructions (Addendum)
Start a gradual exercise program, goal: exercise 3 hours a week Check the  blood pressure 2 or 3 times a week, be sure it is between 110/60 and 135/85. If it is consistently higher or lower, let me know Next visit in 1 year and sooner if needed

## 2013-05-05 NOTE — Assessment & Plan Note (Signed)
Td 08 Diet-exercise discussed  Saw gyn 2 weeks ago, had a PAP, no previous MMG Labs, also check a UA (gyn told her had some blood in the urine)

## 2013-05-05 NOTE — Progress Notes (Signed)
  Subjective:    Patient ID: Miranda [name], female    DOB: 1975-10-12, 37 y.o.   MRN: [id number]  HPI CPX  Past Medical History  Diagnosis Date  . Allergic rhinitis   . Acne   . Hypertension   . Hemorrhoids   . HA (headache) 07/2008    MRI (-), MRA 2 anerurysms?------> CT angio (-); saw Neuro, likely migraines, Rx imitrex  . Fibroids, intramural 10/22/2011   Past Surgical History  Procedure Laterality Date  . Wisdom tooth extraction    . Laparotomy  10/23/2011    Procedure: EXPLORATORY LAPAROTOMY;  Surgeon: Sherron Monday, MD;  Location: [facility];  Service: Gynecology;  Laterality: N/A;  thru incision (laparotomy)/Myomyectomy   History   Social History  . Marital Status: Single    Spouse Name: N/A    Number of Children: 0  . Years of Education: N/A   Occupational History  . Mother [employer]     Social History Main Topics  . Smoking status: Never Smoker   . Smokeless tobacco: Never Used  . Alcohol Use: Yes     Comment: socially  . Drug Use: No  . Sexually Active: Yes    Birth Control/ Protection: Pill   Other Topics Concern  . Not on file   Social History Narrative   Mom lives w/ her   Family History  Problem Relation Age of Onset  . Coronary artery disease      GM in her 60s  . Diabetes      uncle  . Hypertension Mother     M, aunt, GM  . Leukemia      cousin  . Breast cancer Other     aunt  . Colon cancer Neg Hx   . Lung cancer Other     aunt, smoker   Review of Systems Diet and exercise--needs improvement. No chest pain or shortness or breath No nausea, vomiting, diarrhea or blood in the stools. Periods are regular, not heavy, last 4- 5 days. No lower extremity edema, some achiness in her legs, wonders if she has varicose veins and  if she could use compression stockings (no varicose veins on exam, okay to try compression stockings as a trial). No dysuria, gross hematuria. No anxiety depression.     Objective:   Physical Exam BP 130/88  Pulse  82  Ht 5' 6.5" (1.689 m)  Wt 181 lb 6.4 oz (82.283 kg)  BMI 28.84 kg/m2  SpO2 97% General -- alert, well-developed, NAD   Neck --no thyromegaly , normal carotid pulse Lungs -- normal respiratory effort, no intercostal retractions, no accessory muscle use, and normal breath sounds.   Heart-- normal rate, regular rhythm, no murmur, and no gallop.   Abdomen--soft, non-tender, no distention, no masses, no HSM, no guarding, and no rigidity.   Extremities-- no pretibial edema bilaterally  Neurologic-- alert & oriented X3 and strength normal in all extremities. Psych-- Cognition and judgment appear intact. Alert and cooperative with normal attention span and concentration.  not anxious appearing and not depressed appearing.       Assessment & Plan:

## 2013-05-05 NOTE — Assessment & Plan Note (Signed)
Good med compliance, reports normal amb BP Plan-- cont present care

## 2013-05-05 NOTE — Assessment & Plan Note (Signed)
HAs not an issue at this time, has imitrex just in case

## 2013-05-08 ENCOUNTER — Encounter: Payer: Self-pay | Admitting: Internal Medicine

## 2013-05-09 ENCOUNTER — Telehealth: Payer: Self-pay | Admitting: *Deleted

## 2013-05-09 NOTE — Telephone Encounter (Signed)
Mailed letter °

## 2013-05-09 NOTE — Telephone Encounter (Signed)
Message copied by Shirlee More I on Mon May 09, 2013  8:14 AM ------      Message from: Willow Ora E      Created: Sun May 08, 2013  1:33 PM       Please mail the patient a high potassium diet (she knows, no need to call her) ------

## 2013-06-24 ENCOUNTER — Ambulatory Visit (INDEPENDENT_AMBULATORY_CARE_PROVIDER_SITE_OTHER): Payer: No Typology Code available for payment source | Admitting: Family Medicine

## 2013-06-24 ENCOUNTER — Encounter: Payer: Self-pay | Admitting: Family Medicine

## 2013-06-24 VITALS — BP 124/82 | HR 85 | Temp 98.8°F | Wt 180.6 lb

## 2013-06-24 DIAGNOSIS — J019 Acute sinusitis, unspecified: Secondary | ICD-10-CM

## 2013-06-24 MED ORDER — AMOXICILLIN 875 MG PO TABS: 875.0000 mg | ORAL_TABLET | Freq: Two times a day (BID) | ORAL | Status: DC

## 2013-06-24 NOTE — Assessment & Plan Note (Signed)
New.  Pt's sxs and PE consistent w/ infxn.  Start abx.  Restart nasal steroid.  Reviewed supportive care and red flags that should prompt return.  Pt expressed understanding and is in agreement w/ plan.

## 2013-06-24 NOTE — Patient Instructions (Addendum)
This is a sinus infection Start the Amoxicillin twice daily- take w/ food Drink plenty of fluids REST! Restart your nasal spray Call with any questions or concerns Hang in there!

## 2013-06-24 NOTE — Progress Notes (Signed)
  Subjective:    Patient ID: Miranda [name], female    DOB: [birth year], 37 y.o.   MRN: [id number]  HPI URI- has been 'having sinus problems all week' but 'woke up this AM and felt like a Mack truck hit me'.  + HA, facial pain/pressure.  Ear pain- R ear.  Minimal cough.  + PND.  No nausea.  No tooth pain.  No fevers.  No N/V/D.  + sick contacts at work.   Review of Systems For ROS see HPI     Objective:   Physical Exam  Vitals reviewed. Constitutional: She appears well-developed and well-nourished. No distress.  HENT:  Head: Normocephalic and atraumatic.  Right Ear: Tympanic membrane normal.  Left Ear: Tympanic membrane normal.  Nose: Mucosal edema and rhinorrhea present. Right sinus exhibits maxillary sinus tenderness and frontal sinus tenderness. Left sinus exhibits maxillary sinus tenderness and frontal sinus tenderness.  Mouth/Throat: Uvula is midline and mucous membranes are normal. Posterior oropharyngeal erythema present. No oropharyngeal exudate.  Eyes: Conjunctivae and EOM are normal. Pupils are equal, round, and reactive to light.  Neck: Normal range of motion. Neck supple.  Cardiovascular: Normal rate, regular rhythm and normal heart sounds.   Pulmonary/Chest: Effort normal and breath sounds normal. No respiratory distress. She has no wheezes.  Lymphadenopathy:    She has no cervical adenopathy.          Assessment & Plan:

## 2013-07-13 ENCOUNTER — Other Ambulatory Visit: Payer: Self-pay | Admitting: Internal Medicine

## 2013-07-13 NOTE — Telephone Encounter (Signed)
Maxide refill sent to pharmacy

## 2013-07-15 ENCOUNTER — Other Ambulatory Visit: Payer: Self-pay | Admitting: Internal Medicine

## 2013-07-18 ENCOUNTER — Other Ambulatory Visit: Payer: Self-pay | Admitting: *Deleted

## 2013-07-18 MED ORDER — AZELASTINE HCL 0.1 % NA SOLN: 2.0000 | Freq: Two times a day (BID) | NASAL | Status: DC

## 2013-07-18 NOTE — Telephone Encounter (Signed)
Astelin refill sent to pharmacy

## 2013-08-04 ENCOUNTER — Other Ambulatory Visit: Payer: Self-pay

## 2013-11-08 ENCOUNTER — Other Ambulatory Visit: Payer: Self-pay | Admitting: Internal Medicine

## 2013-11-09 ENCOUNTER — Other Ambulatory Visit: Payer: Self-pay | Admitting: Internal Medicine

## 2013-12-22 ENCOUNTER — Encounter: Payer: Self-pay | Admitting: Nurse Practitioner

## 2013-12-22 ENCOUNTER — Ambulatory Visit (INDEPENDENT_AMBULATORY_CARE_PROVIDER_SITE_OTHER): Payer: BC Managed Care – PPO | Admitting: Nurse Practitioner

## 2013-12-22 VITALS — BP 121/80 | HR 80 | Temp 98.8°F | Ht 66.5 in | Wt 184.0 lb

## 2013-12-22 DIAGNOSIS — J029 Acute pharyngitis, unspecified: Secondary | ICD-10-CM

## 2013-12-22 LAB — POCT RAPID STREP A: OTHER: NEGATIVE

## 2013-12-22 NOTE — Progress Notes (Signed)
Pre visit review using our clinic review tool, if applicable. No additional management support is needed unless otherwise documented below in the visit note. 

## 2013-12-22 NOTE — Patient Instructions (Signed)
This is likely a viral sore throat/upper respiratory infection. However, if your culture comes back growing bacteria, I will call in an antibiotic. In the meantime, start salt water & listerene gargles several times daily. You may use benzocaine throat lozenges or throat spray for comfort. Rest, sip fluids every hour.  Feel better!  Sore Throat A sore throat is pain, burning, irritation, or scratchiness of the throat. There is often pain or tenderness when swallowing or talking. A sore throat may be accompanied by other symptoms, such as coughing, sneezing, fever, and swollen neck glands. A sore throat is often the first sign of another sickness, such as a cold, flu, strep throat, or mononucleosis (commonly known as mono). Most sore throats go away without medical treatment. CAUSES  The most common causes of a sore throat include:  A viral infection, such as a cold, flu, or mono.  A bacterial infection, such as strep throat, tonsillitis, or whooping cough.  Seasonal allergies.  Dryness in the air.  Irritants, such as smoke or pollution.  Gastroesophageal reflux disease (GERD). HOME CARE INSTRUCTIONS   Only take over-the-counter medicines as directed by your caregiver.  Drink enough fluids to keep your urine clear or pale yellow.  Rest as needed.  Try using throat sprays, lozenges, or sucking on hard candy to ease any pain (if older than 4 years or as directed).  Sip warm liquids, such as broth, herbal tea, or warm water with honey to relieve pain temporarily. You may also eat or drink cold or frozen liquids such as frozen ice pops.  Gargle with salt water (mix 1 tsp salt with 8 oz of water).  Do not smoke and avoid secondhand smoke.  Put a cool-mist humidifier in your bedroom at night to moisten the air. You can also turn on a hot shower and sit in the bathroom with the door closed for 5 10 minutes. SEEK IMMEDIATE MEDICAL CARE IF:  You have difficulty breathing.  You are unable  to swallow fluids, soft foods, or your saliva.  You have increased swelling in the throat.  Your sore throat does not get better in 7 days.  You have nausea and vomiting.  You have a fever or persistent symptoms for more than 2 3 days.  You have a fever and your symptoms suddenly get worse. MAKE SURE YOU:   Understand these instructions.  Will watch your condition.  Will get help right away if you are not doing well or get worse. Document Released: 10/23/2004 Document Revised: 09/01/2012 Document Reviewed: 05/23/2012 [facility] Patient Information 2014 [place], [place].

## 2013-12-22 NOTE — Progress Notes (Signed)
   Subjective:    Patient ID: Miranda [name], female    DOB: Aug 04, 1976, 38 y.o.   MRN: [id number]  Sore Throat  This is a new problem. The current episode started in the past 7 days (5 da). The problem has been unchanged. Neither side of throat is experiencing more pain than the other. There has been no fever. The pain is moderate. Associated symptoms include abdominal pain. Pertinent negatives include no congestion, coughing, diarrhea, ear pain, headaches, shortness of breath, swollen glands, trouble swallowing or vomiting. She has had no exposure to strep. She has tried nothing for the symptoms.      Review of Systems  Constitutional: Negative for fever, chills, activity change, appetite change and fatigue.  HENT: Positive for sore throat. Negative for congestion, ear pain, postnasal drip and trouble swallowing.   Respiratory: Negative for cough and shortness of breath.   Gastrointestinal: Positive for abdominal pain. Negative for nausea, vomiting and diarrhea.  Skin: Negative for rash.  Neurological: Negative for headaches.       Objective:   Physical Exam  Vitals reviewed. Constitutional: She is oriented to person, place, and time. She appears well-developed and well-nourished. No distress.  HENT:  Head: Normocephalic and atraumatic.  Right Ear: External ear normal.  Left Ear: External ear normal.  Mouth/Throat: Oropharynx is clear and moist. No oropharyngeal exudate.    Eyes: Conjunctivae are normal. Right eye exhibits no discharge. Left eye exhibits no discharge.  Neck: Normal range of motion. Neck supple. No thyromegaly present.  Cardiovascular: Normal rate, regular rhythm and normal heart sounds.   No murmur heard. Pulmonary/Chest: Effort normal and breath sounds normal. No respiratory distress. She has no wheezes. She has no rales.  Abdominal: Soft. Bowel sounds are normal. She exhibits no distension and no mass. There is no tenderness. There is no rebound and no guarding.   Lymphadenopathy:    She has cervical adenopathy (anterior cervical LAD, NT, moveable).  Neurological: She is alert and oriented to person, place, and time.  Skin: Skin is warm and dry.  Psychiatric: She has a normal mood and affect. Her behavior is normal. Thought content normal.          Assessment & Plan:  1. Sore throat Odd shaped uvula & R tonsil - Upper Respiratory Culture - POCT Rapid Strep A-Neg See pt instructions.

## 2013-12-25 LAB — CULTURE, UPPER RESPIRATORY: ORGANISM ID, BACTERIA: NORMAL

## 2014-02-07 ENCOUNTER — Other Ambulatory Visit: Payer: Self-pay | Admitting: Internal Medicine

## 2014-02-12 ENCOUNTER — Emergency Department (HOSPITAL_COMMUNITY)
Admission: EM | Admit: 2014-02-12 | Discharge: 2014-02-12 | Disposition: A | Discharge status: Home / Self Care | Payer: BC Managed Care – PPO | Attending: Emergency Medicine | Admitting: Emergency Medicine

## 2014-02-12 ENCOUNTER — Encounter (HOSPITAL_COMMUNITY): Payer: Self-pay | Admitting: Emergency Medicine

## 2014-02-12 DIAGNOSIS — Z8709 Personal history of other diseases of the respiratory system: Secondary | ICD-10-CM | POA: Insufficient documentation

## 2014-02-12 DIAGNOSIS — R55 Syncope and collapse: Secondary | ICD-10-CM | POA: Insufficient documentation

## 2014-02-12 DIAGNOSIS — R5383 Other fatigue: Secondary | ICD-10-CM

## 2014-02-12 DIAGNOSIS — E876 Hypokalemia: Secondary | ICD-10-CM

## 2014-02-12 DIAGNOSIS — Z3202 Encounter for pregnancy test, result negative: Secondary | ICD-10-CM | POA: Insufficient documentation

## 2014-02-12 DIAGNOSIS — Z8742 Personal history of other diseases of the female genital tract: Secondary | ICD-10-CM | POA: Insufficient documentation

## 2014-02-12 DIAGNOSIS — R42 Dizziness and giddiness: Secondary | ICD-10-CM | POA: Insufficient documentation

## 2014-02-12 DIAGNOSIS — Z79899 Other long term (current) drug therapy: Secondary | ICD-10-CM | POA: Insufficient documentation

## 2014-02-12 DIAGNOSIS — R5381 Other malaise: Secondary | ICD-10-CM | POA: Insufficient documentation

## 2014-02-12 DIAGNOSIS — Z872 Personal history of diseases of the skin and subcutaneous tissue: Secondary | ICD-10-CM | POA: Insufficient documentation

## 2014-02-12 DIAGNOSIS — I10 Essential (primary) hypertension: Secondary | ICD-10-CM | POA: Insufficient documentation

## 2014-02-12 LAB — I-STAT CHEM 8, ED
BUN: 15 mg/dL (ref 6–23)
CHLORIDE: 97 meq/L (ref 96–112)
CREATININE: 0.9 mg/dL (ref 0.50–1.10)
Calcium, Ion: 1.12 mmol/L (ref 1.12–1.23)
Glucose, Bld: 95 mg/dL (ref 70–99)
HCT: 42 % (ref 36.0–46.0)
Hemoglobin: 14.3 g/dL (ref 12.0–15.0)
Potassium: 2.9 mEq/L — CL (ref 3.7–5.3)
SODIUM: 136 meq/L — AB (ref 137–147)
TCO2: 25 mmol/L (ref 0–100)

## 2014-02-12 LAB — POC URINE PREG, ED: Preg Test, Ur: NEGATIVE

## 2014-02-12 MED ORDER — POTASSIUM CHLORIDE 10 MEQ/100ML IV SOLN
10.0000 meq | Freq: Once | INTRAVENOUS | Status: AC
  Administered 2014-02-12: 10 meq via INTRAVENOUS
  Filled 2014-02-12: qty 100

## 2014-02-12 MED ORDER — POTASSIUM CHLORIDE CRYS ER 20 MEQ PO TBCR
40.0000 meq | EXTENDED_RELEASE_TABLET | Freq: Once | ORAL | Status: AC
  Administered 2014-02-12: 40 meq via ORAL
  Filled 2014-02-12: qty 2

## 2014-02-12 NOTE — ED Provider Notes (Signed)
CSN: [id number]     Arrival date & time 02/12/14  0250 History   First MD Initiated Contact with Patient 02/12/14 0259     Chief Complaint  Patient presents with  . Loss of Consciousness     (Consider location/radiation/quality/duration/timing/severity/associated sxs/prior Treatment) HPI History provided by pt.  Pt presents w/ syncope.  Was standing outside, talking to friends at a party this morning, when she suddenly developed lightheadedness and generalized weakness.  Had a witnessed syncopal episode, but is unsure of how long she was unconscious.  Has not experienced any recent palpitations, CP or SOB.  No recent illness.  No drug abuse and had only one alcoholic drink tonight.  Has been eating/drinking normally.  Has h/o HTN, otherwise healthy, and BP has been running high recently, systolic >676 over the past two days.  Checked because she had been experiencing mild dizziness.  Took an extra 1/2 tab of her cardizem yesterday and the day before.  Her PCP instructed her to call him if her BP went above 125/85.  Past Medical History  Diagnosis Date  . Allergic rhinitis   . Acne   . Hypertension   . Hemorrhoids   . HA (headache) 07/2008    MRI (-), MRA 2 anerurysms?------> CT angio (-); saw Neuro, likely migraines, Rx imitrex  . Fibroids, intramural 10/22/2011   Past Surgical History  Procedure Laterality Date  . Wisdom tooth extraction    . Laparotomy  10/23/2011    Procedure: EXPLORATORY LAPAROTOMY;  Surgeon: Thornell Sartorius, MD;  Location: [facility];  Service: Gynecology;  Laterality: N/A;  thru incision (laparotomy)/Myomyectomy   Family History  Problem Relation Age of Onset  . Coronary artery disease      GM in her 24s  . Diabetes      uncle  . Hypertension Mother     M, aunt, GM  . Leukemia      cousin  . Breast cancer Other     aunt  . Colon cancer Neg Hx   . Lung cancer Other     aunt, smoker   History  Substance Use Topics  . Smoking status: Never Smoker   . Smokeless  tobacco: Never Used  . Alcohol Use: Yes     Comment: socially   OB History   Grav Para Term Preterm Abortions TAB SAB Ect Mult Living   1    1          Review of Systems  All other systems reviewed and are negative.     Allergies  Review of patient's allergies indicates no known allergies.  Home Medications   Prior to Admission medications   Medication Sig Start Date End Date Taking? Authorizing Provider  azelastine (ASTELIN) 137 MCG/SPRAY nasal spray PLACE 2 SPRAYS INTO THE NOSE 2 (TWO) TIMES DAILY. USE IN EACH NOSTRIL AS DIRECTED    Colon Branch, MD  diltiazem (CARDIZEM) 90 MG tablet TAKE 1/2 TABLET TWICE A DAY    Colon Branch, MD  Norgestim-Eth Estrad Triphasic (ORTHO TRI-CYCLEN, 28, PO) Take by mouth daily.    Historical Provider, MD  SUMAtriptan (IMITREX) 50 MG tablet 1 tab by mouth w/ onset of HA; may repeat in 2 hours in HA worse. No More than 2 tabs a day.     Historical Provider, MD  triamterene-hydrochlorothiazide (MAXZIDE-25) 37.5-25 MG per tablet TAKE 1 TABLET BY MOUTH EVERY DAY 11/09/13   Colon Branch, MD   BP 131/88  Pulse 82  Temp(Src) 98.2  F (36.8 C) (Oral)  Resp 16  SpO2 100%  LMP 01/29/2014 Physical Exam  Nursing note and vitals reviewed. Constitutional: She is oriented to person, place, and time. She appears well-developed and well-nourished. No distress.  HENT:  Head: Normocephalic and atraumatic.  Mouth/Throat: Oropharynx is clear and moist.  Eyes: Conjunctivae are normal.  Normal appearance  Neck: Normal range of motion.  Cardiovascular: Normal rate, regular rhythm and intact distal pulses.   Pulmonary/Chest: Effort normal and breath sounds normal. No respiratory distress.  Musculoskeletal: Normal range of motion.  Neurological: She is alert and oriented to person, place, and time.  Skin: Skin is warm and dry. No rash noted.  Psychiatric: She has a normal mood and affect. Her behavior is normal.    ED Course  Procedures (including critical care  time) Labs Review Labs Reviewed  I-STAT CHEM 8, ED - Abnormal; Notable for the following:    Sodium 136 (*)    Potassium 2.9 (*)    All other components within normal limits  POC URINE PREG, ED    Imaging Review No results found.   EKG Interpretation None      MDM   Final diagnoses:  Syncope  Hypokalemia    38yo F w/ HTN presents w/ syncopal episode w/ prodrome this morning.  No recent CP/SOB/palpitations, illnesses, change in diet, drug abuse or heavy alcohol use.  Has taken an extra 1/2 tab of diltiazem the last two days b/c SBP >130 and she was experiencing mild dizziness that she attributed to HTN.  Currently feels mildly nauseated.  No significant exam findings. EKG and Chem 8 pending, but I suspect that syncope is secondary to orthostasis.  3:48 AM   Labs sig for hypokalemia.  IV/PO supplementation ordered. Pt stable. 4:46 AM   Pt is not orthostatic.  Will d/c home to f/u w/ her PCP when potassium has finished running.  Recommended rest, fluids and compliance w/ anti-hypertensive dosing.  Return precautions discussed. 5:51 AM     Remer Macho, PA-C 02/12/14 [phone]

## 2014-02-12 NOTE — ED Notes (Addendum)
Pt reports that she has a hx of hypertension, reports that she took an extra half dose of her Cardizem yesterday because "my BP was high" pt states her systolic was 389 yesterday and 122 the day before. Pt states that she became lightheaded at 0200, which she has in the past, but tonight she lost consciousness, reports she did not hit her head, does not know how long she was unconscious.  Pt states she still feels lightheaded and nauseated. Pt a&o x4, skin warm an dry.

## 2014-02-12 NOTE — Discharge Instructions (Signed)
Continue your blood pressure medications as prescribed.  Get rest and drink plenty of fluids.  Follow up with your primary care doctor this week.  See if he can recheck your potassium level.  It is 2.9 today.  Return to the ER if you develop chest pain, shortness of breath, another passing out spell.

## 2014-02-12 NOTE — ED Notes (Signed)
Abnormal value on Chem 8 given to Dr. Dina Rich.

## 2014-02-12 NOTE — ED Provider Notes (Signed)
Medical screening examination/treatment/procedure(s) were performed by non-physician practitioner and as supervising physician I was immediately available for consultation/collaboration.   EKG Interpretation   Date/Time:  Sunday Feb 12 2014 03:53:18 EDT Ventricular Rate:  75 PR Interval:  210 QRS Duration: 76 QT Interval:  406 QTC Calculation: 453 R Axis:   72 Text Interpretation:  Sinus rhythm Prolonged PR interval Borderline T wave  abnormalities Baseline wander in lead(s) V2 Confirmed by Dina Rich  MD,  COURTNEY ([id number]) on 02/12/2014 9:31:22 AM        Merryl Hacker, MD 02/12/14 [phone]

## 2014-02-13 ENCOUNTER — Telehealth: Payer: Self-pay | Admitting: Internal Medicine

## 2014-02-13 NOTE — Telephone Encounter (Signed)
Left message for call back Non identifiable  

## 2014-02-13 NOTE — Telephone Encounter (Signed)
Caller name: Audia Relation to pt: self  Call back number: [phone]   Reason for call:  Pt called in to schedule follow up from ED visit on 5/17 that was due to fainting/tiredness.  Gave pt an apt on 5/19 @ 10:30, but pt still wants to speak with RN in regards to being tired.

## 2014-02-14 ENCOUNTER — Ambulatory Visit (INDEPENDENT_AMBULATORY_CARE_PROVIDER_SITE_OTHER): Payer: BC Managed Care – PPO | Admitting: Internal Medicine

## 2014-02-14 ENCOUNTER — Encounter: Payer: Self-pay | Admitting: Internal Medicine

## 2014-02-14 VITALS — BP 123/82 | HR 70 | Temp 98.7°F | Wt 186.0 lb

## 2014-02-14 DIAGNOSIS — I10 Essential (primary) hypertension: Secondary | ICD-10-CM

## 2014-02-14 DIAGNOSIS — R55 Syncope and collapse: Secondary | ICD-10-CM

## 2014-02-14 LAB — BASIC METABOLIC PANEL
BUN: 14 mg/dL (ref 6–23)
CALCIUM: 9.1 mg/dL (ref 8.4–10.5)
CHLORIDE: 104 meq/L (ref 96–112)
CO2: 26 meq/L (ref 19–32)
Creatinine, Ser: 0.8 mg/dL (ref 0.4–1.2)
GFR: 106.66 mL/min (ref >60.00)
GLUCOSE: 74 mg/dL (ref 70–99)
Potassium: 4 mEq/L (ref 3.5–5.1)
SODIUM: 135 meq/L (ref 135–145)

## 2014-02-14 MED ORDER — LOSARTAN POTASSIUM 50 MG PO TABS: 50.0000 mg | ORAL_TABLET | Freq: Every day | ORAL | Status: DC

## 2014-02-14 NOTE — Patient Instructions (Signed)
Get your blood work before you leave   Check the  blood pressure 2 or 3 times a week be sure it is between 110/60 and 140/85. Ideal blood pressure is 120/80. If it is consistently higher or lower, let me know  Stop the diuretic Maxzide, start losartan 50 mg one tablet daily  Next visit is for routine check up in 2 weeks

## 2014-02-14 NOTE — Telephone Encounter (Signed)
Patient did not return the call. Was seen by PCP 02/14/2014

## 2014-02-14 NOTE — Assessment & Plan Note (Signed)
Given recent syncope and hypokalemia, will discontinue Maxzide and start losartan. BMP today, BMP in 2 weeks.

## 2014-02-14 NOTE — Progress Notes (Signed)
Subjective:    Patient ID: Miranda [name], female    DOB: 1976-08-10, 38 y.o.   MRN: [id number]  DOS:  02/14/2014 Type of  visit: ER f/u, note reviewed Went to the ER 02/12/2014 after a syncopal episode. The day  prior she was feeling slightly dizzy and took extra diltiazema. UPT was negative, potassium was 2.9 and it  was replenished. EKG nsr, Hg normal  The patient described the episode to me: She was indoors, in a very hot environment, she was not sweating, she decided to go outside and suddenly felt lightheaded, she knew she probably would faint and braised herself against a car, shortly after she passed out for few seconds. No injury, she was immediately helped by friends. Denies bladder or bowel incontinence, seizures, chest pain, difficulty breathing. Postictal? Was slightly "out of it" for a minute. No further episodes. The week prior to the events, she did have to headaches, associated with mild nausea. Migraines? Headaches were not severe and  no headaches this week.   ROS See HPI  Past Medical History  Diagnosis Date  . Allergic rhinitis   . Acne   . Hypertension   . Hemorrhoids   . HA (headache) 07/2008    MRI (-), MRA 2 anerurysms?------> CT angio (-); saw Neuro, likely migraines, Rx imitrex  . Fibroids, intramural 10/22/2011    Past Surgical History  Procedure Laterality Date  . Wisdom tooth extraction    . Laparotomy  10/23/2011    Procedure: EXPLORATORY LAPAROTOMY;  Surgeon: Thornell Sartorius, MD;  Location: [facility];  Service: Gynecology;  Laterality: N/A;  thru incision (laparotomy)/Myomyectomy    History   Social History  . Marital Status: Single    Spouse Name: N/A    Number of Children: 0  . Years of Education: N/A   Occupational History  . Mother [employer]     Social History Main Topics  . Smoking status: Never Smoker   . Smokeless tobacco: Never Used  . Alcohol Use: Yes     Comment: socially  . Drug Use: No  . Sexual Activity: Yes    Birth  Control/ Protection: Pill   Other Topics Concern  . Not on file   Social History Narrative   Mom lives w/ her        Medication List       This list is accurate as of: 02/14/14  6:50 PM.  Always use your most recent med list.               diltiazem 90 MG tablet  Commonly known as:  CARDIZEM  Take 45 mg by mouth 2 (two) times daily.     losartan 50 MG tablet  Commonly known as:  COZAAR  Take 1 tablet (50 mg total) by mouth daily.     ORTHO TRI-CYCLEN (28) PO  Take 1 tablet by mouth daily.           Objective:   Physical Exam BP 123/82  Pulse 70  Temp(Src) 98.7 F (37.1 C) (Oral)  Wt 186 lb (84.369 kg)  SpO2 100%  LMP 01/29/2014 General -- alert, well-developed, NAD.  Neck --no thyromegaly , normal carotid pulse HEENT-- Not pale.  Lungs -- normal respiratory effort, no intercostal retractions, no accessory muscle use, and normal breath sounds.  Heart-- normal rate, regular rhythm, no murmur.  Extremities-- no pretibial edema bilaterally  Neurologic--  alert & oriented X3. Speech normal, gait normal, strength normal in all extremities.  Psych-- Cognition and judgment appear intact. Cooperative with normal attention span and concentration. No anxious or depressed appearing.        Assessment & Plan:

## 2014-02-14 NOTE — Assessment & Plan Note (Signed)
Syncope of unclear etiology, no obvious cardiac or neurological symptoms associated with the event. She was taking a diuretic, she was on a very hot environment and  her potassium was low  ---> mild dehydration?. Plan: We'd just her BP meds, see below

## 2014-02-14 NOTE — Progress Notes (Signed)
Pre-visit discussion using our clinic review tool. No additional management support is needed unless otherwise documented below in the visit note.  

## 2014-02-22 ENCOUNTER — Telehealth: Payer: Self-pay | Admitting: *Deleted

## 2014-02-22 NOTE — Telephone Encounter (Signed)
Spoke with pt who reports ongoing problems with dizziness. Appt moved from 03/03/14 to tomorrow 02/23/14 with Dr.Paz

## 2014-02-23 ENCOUNTER — Ambulatory Visit (INDEPENDENT_AMBULATORY_CARE_PROVIDER_SITE_OTHER): Payer: BC Managed Care – PPO | Admitting: Internal Medicine

## 2014-02-23 ENCOUNTER — Encounter: Payer: Self-pay | Admitting: Internal Medicine

## 2014-02-23 VITALS — BP 117/82 | HR 70 | Temp 97.9°F | Wt 191.0 lb

## 2014-02-23 DIAGNOSIS — R5383 Other fatigue: Secondary | ICD-10-CM

## 2014-02-23 DIAGNOSIS — R5381 Other malaise: Secondary | ICD-10-CM

## 2014-02-23 DIAGNOSIS — I10 Essential (primary) hypertension: Secondary | ICD-10-CM

## 2014-02-23 LAB — FOLATE: Folate: 9.1 ng/mL (ref >5.9)

## 2014-02-23 LAB — VITAMIN B12: Vitamin B-12: 326 pg/mL (ref 211–911)

## 2014-02-23 MED ORDER — AMLODIPINE BESYLATE 10 MG PO TABS: 10.0000 mg | ORAL_TABLET | Freq: Every day | ORAL | Status: DC

## 2014-02-23 NOTE — Patient Instructions (Signed)
Take medicines as prescribed Call if problems  Check the  blood pressure 2 or 3 times a   week be sure it is between 110/60 and 140/85. Ideal blood pressure is 120/80. If it is consistently higher or lower, let me know  Next visit in 2 months

## 2014-02-23 NOTE — Progress Notes (Signed)
   Subjective:    Patient ID: Miranda [name], female    DOB: Sep 03, 1976, 38 y.o.   MRN: [id number]  DOS:  02/23/2014 Type of  Visit: Acute  since the last time she was here, she started losartan, having dizziness since then. Episodes last few seconds, they're on and off, not necessarily related to moving or standing up. Her BP has been great in the last few days at ~  117. Dizziness is not associated with syncope, presyncope, visual disturbances. Also feels very fatigued  ROS Denies lower extremity edema, cough or headaches  Past Medical History  Diagnosis Date  . Allergic rhinitis   . Acne   . Hypertension   . Hemorrhoids   . HA (headache) 07/2008    MRI (-), MRA 2 anerurysms?------> CT angio (-); saw Neuro, likely migraines, Rx imitrex  . Fibroids, intramural 10/22/2011    Past Surgical History  Procedure Laterality Date  . Wisdom tooth extraction    . Laparotomy  10/23/2011    Procedure: EXPLORATORY LAPAROTOMY;  Surgeon: Thornell Sartorius, MD;  Location: [facility];  Service: Gynecology;  Laterality: N/A;  thru incision (laparotomy)/Myomyectomy    History   Social History  . Marital Status: Single    Spouse Name: N/A    Number of Children: 0  . Years of Education: N/A   Occupational History  . Mother [employer]     Social History Main Topics  . Smoking status: Never Smoker   . Smokeless tobacco: Never Used  . Alcohol Use: Yes     Comment: socially  . Drug Use: No  . Sexual Activity: Yes    Birth Control/ Protection: Pill   Other Topics Concern  . Not on file   Social History Narrative   Mom lives w/ her        Medication List       This list is accurate as of: 02/23/14  1:51 PM.  Always use your most recent med list.               amLODipine 10 MG tablet  Commonly known as:  NORVASC  Take 1 tablet (10 mg total) by mouth daily.     ORTHO TRI-CYCLEN (28) PO  Take 1 tablet by mouth daily.           Objective:   Physical Exam BP 117/82  Pulse 70   Temp(Src) 97.9 F (36.6 C)  Wt 191 lb (86.637 kg)  SpO2 98%  LMP 01/29/2014 General -- alert, well-developed, NAD.  Lungs -- normal respiratory effort, no intercostal retractions, no accessory muscle use, and normal breath sounds.  Heart-- normal rate, regular rhythm, no murmur.   Extremities-- no pretibial edema bilaterally  Neurologic--  alert & oriented X3. Speech normal, gait normal, strength normal in all extremities.  EOMI, PERLA   Psych-- Cognition and judgment appear intact. Cooperative with normal attention span and concentration. No anxious or depressed appearing.        Assessment & Plan:

## 2014-02-23 NOTE — Assessment & Plan Note (Addendum)
See previous entry, BP well-controlled with losartan and diltiazem however she has developed dizziness. We need to adjust her medications in a way that controlled her BP without side effects Previously higher doses of diltiazem caused insomnia. Plan: D/c diltiazem and losartan Start amlodipine 10 mg daily. Watch for side effects, may decrease dose to 5 mg daily as needed. If the dizziness does not resolve after she d/c losartan she is to let me know. If amlodipine doesn't work for her, will consider reintroduce diltiazem plus a low dose of diuretic or a different ARB. Also complains of fatigue, recommend to go back to her multivitamins daily and will check S01 and folic acid

## 2014-02-23 NOTE — Progress Notes (Signed)
Pre visit review using our clinic review tool, if applicable. No additional management support is needed unless otherwise documented below in the visit note. 

## 2014-02-24 ENCOUNTER — Telehealth: Payer: Self-pay | Admitting: Internal Medicine

## 2014-02-24 NOTE — Telephone Encounter (Signed)
Relevant patient education assigned to patient using Emmi. ° °

## 2014-03-03 ENCOUNTER — Ambulatory Visit: Payer: BC Managed Care – PPO | Admitting: Internal Medicine

## 2014-03-06 ENCOUNTER — Telehealth: Payer: Self-pay | Admitting: Internal Medicine

## 2014-03-06 NOTE — Telephone Encounter (Addendum)
Left detailed message on voice mail with Dr. Ethel Rana instructions regarding the changes to Norvasc.  Advised to return my call if they had any questions.

## 2014-03-06 NOTE — Telephone Encounter (Signed)
Due to swelling, decreased amlodipine 10 mg to half tablet daily, see if that help. Continue monitoring BPs. If swelling becomes an issue---> let us know

## 2014-03-06 NOTE — Telephone Encounter (Signed)
Recently , BP medications were changed, please check on the patient --->  Feeling better? Ambulatory BPs?

## 2014-03-06 NOTE — Telephone Encounter (Signed)
Spoke with patient who stated her B/P averages 120/80 but she is also beginning to experience feet and ankle swelling. Patient started on Norvasc 10mg  daily on 02/23/14. Do you want her to continue with this med? Please advise.

## 2014-03-06 NOTE — Telephone Encounter (Signed)
Left message on voice mail for the patient to return my call regarding B/P due to recent change in therapy.

## 2014-05-07 ENCOUNTER — Other Ambulatory Visit: Payer: Self-pay | Admitting: Internal Medicine

## 2014-05-09 ENCOUNTER — Ambulatory Visit (INDEPENDENT_AMBULATORY_CARE_PROVIDER_SITE_OTHER): Payer: BC Managed Care – PPO | Admitting: Internal Medicine

## 2014-05-09 ENCOUNTER — Encounter: Payer: Self-pay | Admitting: Internal Medicine

## 2014-05-09 VITALS — BP 120/87 | HR 87 | Temp 97.9°F | Ht 67.1 in | Wt 187.0 lb

## 2014-05-09 DIAGNOSIS — I10 Essential (primary) hypertension: Secondary | ICD-10-CM

## 2014-05-09 DIAGNOSIS — Z0000 Encounter for general adult medical examination without abnormal findings: Secondary | ICD-10-CM

## 2014-05-09 MED ORDER — AMLODIPINE BESYLATE 10 MG PO TABS: 5.0000 mg | ORAL_TABLET | Freq: Every day | ORAL | Status: DC

## 2014-05-09 NOTE — Assessment & Plan Note (Addendum)
Td 08 Diet-exercise discussed , she is actually doing better  Saw gyn few days ago Labs, also check a UA  Also complained of occasional sinus congestion, recommend Flonase

## 2014-05-09 NOTE — Progress Notes (Signed)
Pre visit review using our clinic review tool, if applicable. No additional management support is needed unless otherwise documented below in the visit note. 

## 2014-05-09 NOTE — Progress Notes (Signed)
Subjective:    Patient ID: Miranda [name], female    DOB: 05-14-1976, 38 y.o.   MRN: [id number]  DOS:  05/09/2014 Type of visit - description: CPX History: Doing well, good compliance with BP medications; occasionally has sinus problems, for the last 2 days she's had some urinary urgency without dysuria or gross hematuria.  ROS Denies chest pain, difficulty breathing. No nausea, vomiting, diarrhea or blood in the stools. Occasionally has lower extremity edema, minimal, not bothersome. Denies anxiety or depression  Past Medical History  Diagnosis Date  . Allergic rhinitis   . Acne   . Hypertension   . Hemorrhoids   . HA (headache) 07/2008    MRI (-), MRA 2 anerurysms?------> CT angio (-); saw Neuro, likely migraines, Rx imitrex  . Fibroids, intramural 10/22/2011    Past Surgical History  Procedure Laterality Date  . Wisdom tooth extraction    . Laparotomy  10/23/2011    Procedure: EXPLORATORY LAPAROTOMY;  Surgeon: Thornell Sartorius, MD;  Location: [facility];  Service: Gynecology;  Laterality: N/A;  thru incision (laparotomy)/Myomyectomy    History   Social History  . Marital Status: Single    Spouse Name: N/A    Number of Children: 0  . Years of Education: N/A   Occupational History  . Mother [employer]     Social History Main Topics  . Smoking status: Never Smoker   . Smokeless tobacco: Never Used  . Alcohol Use: Yes     Comment: socially  . Drug Use: No  . Sexual Activity: Yes    Birth Control/ Protection: Pill   Other Topics Concern  . Not on file   Social History Narrative   Mom lives w/ her     Family History  Problem Relation Age of Onset  . Coronary artery disease Other     GM in her 55s  . Diabetes Other     uncle  . Hypertension Mother     M, aunt, GM  . Leukemia Other     cousin  . Breast cancer Other     aunt  . Colon cancer Neg Hx   . Lung cancer Other     aunt, smoker       Medication List       This list is accurate as of: 05/09/14   4:59 PM.  Always use your most recent med list.               amLODipine 10 MG tablet  Commonly known as:  NORVASC  Take 0.5 tablets (5 mg total) by mouth daily.     ORTHO TRI-CYCLEN (28) PO  Take 1 tablet by mouth daily.           Objective:   Physical Exam BP 120/87  Pulse 87  Temp(Src) 97.9 F (36.6 C)  Ht 5' 7.1" (1.704 m)  Wt 187 lb (84.823 kg)  BMI 29.21 kg/m2  SpO2 98% General -- alert, well-developed, NAD.  Neck --no thyromegaly  HEENT-- Not pale. Lungs -- normal respiratory effort, no intercostal retractions, no accessory muscle use, and normal breath sounds.  Heart-- normal rate, regular rhythm, no murmur.  Abdomen-- Not distended, good bowel sounds,soft, non-tender. Extremities-- no pretibial edema bilaterally  Neurologic--  alert & oriented X3. Speech normal, gait appropriate for age, strength symmetric and appropriate for age.  Psych-- Cognition and judgment appear intact. Cooperative with normal attention span and concentration. No anxious or depressed appearing.     Assessment &  Plan:

## 2014-05-09 NOTE — Assessment & Plan Note (Signed)
Well-controlled, currently on amlodipine 5 mg, no apparent side effects, minimal edema. Plan: Refill medications, followup one year

## 2014-05-09 NOTE — Patient Instructions (Signed)
Get an appointment for  your blood work before you leave, fasting: FLP TSH UA and UCX  flonase OTC 2 sprays on each side of the nose daily   Check the  blood pressure 2 or 3 times a month   be sure it is between 110/60 and 140/85. Ideal blood pressure is 120/80. If it is consistently higher or lower, let me know    Next visit is for a physical exam in 1 year ,  fasting Please make an appointment

## 2014-05-10 ENCOUNTER — Other Ambulatory Visit (INDEPENDENT_AMBULATORY_CARE_PROVIDER_SITE_OTHER): Payer: BC Managed Care – PPO

## 2014-05-10 DIAGNOSIS — I10 Essential (primary) hypertension: Secondary | ICD-10-CM

## 2014-05-10 DIAGNOSIS — Z0000 Encounter for general adult medical examination without abnormal findings: Secondary | ICD-10-CM

## 2014-05-10 DIAGNOSIS — N390 Urinary tract infection, site not specified: Secondary | ICD-10-CM

## 2014-05-10 LAB — URINALYSIS, ROUTINE W REFLEX MICROSCOPIC
BILIRUBIN URINE: NEGATIVE
Ketones, ur: NEGATIVE
LEUKOCYTES UA: NEGATIVE
NITRITE: POSITIVE — AB
PH: 6.5 (ref 5.0–8.0)
Specific Gravity, Urine: 1.01 (ref 1.000–1.030)
Total Protein, Urine: NEGATIVE
Urine Glucose: NEGATIVE
Urobilinogen, UA: 0.2 (ref 0.0–1.0)

## 2014-05-10 LAB — LIPID PANEL
CHOLESTEROL: 157 mg/dL (ref 0–200)
HDL: 54.7 mg/dL (ref >39.00)
LDL CALC: 82 mg/dL (ref 0–99)
NonHDL: 102.3
Total CHOL/HDL Ratio: 3
Triglycerides: 102 mg/dL (ref 0.0–149.0)
VLDL: 20.4 mg/dL (ref 0.0–40.0)

## 2014-05-10 LAB — TSH: TSH: 1.04 u[IU]/mL (ref 0.35–4.50)

## 2014-05-11 ENCOUNTER — Telehealth: Payer: Self-pay

## 2014-05-11 NOTE — Telephone Encounter (Signed)
Advised per lab results

## 2014-05-11 NOTE — Patient Instructions (Signed)
Advised per result notes

## 2014-05-11 NOTE — Telephone Encounter (Signed)
Caller name:Tacey Relation to pt: Call back [phone] Pharmacy:  Reason for call: Miranda [name] would like for someone to call her back with her results from yesterday

## 2014-05-14 LAB — URINE CULTURE: Colony Count: >=100000

## 2014-05-15 MED ORDER — CIPROFLOXACIN HCL 500 MG PO TABS: 500.0000 mg | ORAL_TABLET | Freq: Two times a day (BID) | ORAL | Status: DC

## 2014-05-15 NOTE — Addendum Note (Signed)
Addended by: Wilfrid Lund on: 05/15/2014 03:13 PM   Modules accepted: Orders

## 2014-07-31 ENCOUNTER — Encounter: Payer: Self-pay | Admitting: Internal Medicine

## 2014-12-28 ENCOUNTER — Telehealth: Payer: Self-pay | Admitting: Internal Medicine

## 2014-12-28 NOTE — Telephone Encounter (Signed)
Caller name: Bowen Relation to pt: self Call back number: [phone] Pharmacy: [facility] [address]  Reason for call:   Patient states that she has been having ongoing sinus issues. Patient has been taking clariten, nasal spray, and mucenex and that is not helping. She wants to know what else she could do

## 2014-12-28 NOTE — Telephone Encounter (Signed)
If OTC medications are not working, Pt will need to schedule appt with Dr. Larose Kells or other provider.

## 2014-12-28 NOTE — Telephone Encounter (Signed)
Appointment scheduled.

## 2014-12-29 ENCOUNTER — Telehealth: Payer: Self-pay | Admitting: Medical

## 2014-12-29 ENCOUNTER — Ambulatory Visit (INDEPENDENT_AMBULATORY_CARE_PROVIDER_SITE_OTHER): Payer: BLUE CROSS/BLUE SHIELD | Admitting: Medical

## 2014-12-29 ENCOUNTER — Encounter: Payer: Self-pay | Admitting: Medical

## 2014-12-29 VITALS — BP 157/93 | HR 96 | Temp 98.6°F | Ht 67.1 in | Wt 189.8 lb

## 2014-12-29 DIAGNOSIS — J0100 Acute maxillary sinusitis, unspecified: Secondary | ICD-10-CM | POA: Insufficient documentation

## 2014-12-29 MED ORDER — AMLODIPINE BESYLATE 10 MG PO TABS: 5.0000 mg | ORAL_TABLET | Freq: Every day | ORAL | Status: DC

## 2014-12-29 MED ORDER — MOMETASONE FUROATE 50 MCG/ACT NA SUSP: NASAL | Status: DC

## 2014-12-29 MED ORDER — FLUTICASONE PROPIONATE 50 MCG/ACT NA SUSP: 2.0000 | Freq: Every day | NASAL | Status: DC

## 2014-12-29 MED ORDER — BENZONATATE 100 MG PO CAPS: 100.0000 mg | ORAL_CAPSULE | Freq: Three times a day (TID) | ORAL | Status: DC | PRN

## 2014-12-29 MED ORDER — CEFDINIR 300 MG PO CAPS: 300.0000 mg | ORAL_CAPSULE | Freq: Two times a day (BID) | ORAL | Status: DC

## 2014-12-29 NOTE — Patient Instructions (Signed)
Sinusitis, acute maxillary Sinusitis following allergic rhinitis. But also possible bronchitis.  Cefdinir rx, nasonex rx, and benzonatate. (Can use allegra otc in place of claritin)  Can continue astelin.  Follow up in 7 days persisting symptoms or as needed worsening.    Refill of your bp med today. Borderline bp. Recommend checking daily. If not consistently below 140/90 then follow up with pcp for possible med adjustment.

## 2014-12-29 NOTE — Progress Notes (Signed)
Pre visit review using our clinic review tool, if applicable. No additional management support is needed unless otherwise documented below in the visit note. 

## 2014-12-29 NOTE — Telephone Encounter (Signed)
Called in flonase

## 2014-12-29 NOTE — Progress Notes (Signed)
Subjective:    Patient ID: Miranda [name], female    DOB: 04-06-1976, 39 y.o.   MRN: [id number]  HPI  Pt in with possible allergies since Sunday. A lot of cough last night. Nasal congestion and runny nose since Sunday. Pt did feel a lot of pnd. Pt was trying claritin and astelin which she had from last year. She has spring allergies. Now some productive cough. Faint sinus pressure. Faint chest congestion. No fever, no chills or sweats. Pt not smoker.  LMP- About 3 wks ago.  Review of Systems  Constitutional: Negative for fever, chills and fatigue.  HENT: Positive for congestion, postnasal drip, rhinorrhea and sinus pressure. Negative for ear pain, sore throat, tinnitus and trouble swallowing.   Respiratory: Positive for cough. Negative for choking, chest tightness, shortness of breath and wheezing.   Cardiovascular: Negative for chest pain and palpitations.  Musculoskeletal: Negative for back pain.  Neurological: Negative for dizziness, seizures, facial asymmetry, weakness, light-headedness and numbness.  Hematological: Negative for adenopathy. Does not bruise/bleed easily.  Psychiatric/Behavioral: Negative for behavioral problems and confusion.    Past Medical History  Diagnosis Date  . Allergic rhinitis   . Acne   . Hypertension   . Hemorrhoids   . HA (headache) 07/2008    MRI (-), MRA 2 anerurysms?------> CT angio (-); saw Neuro, likely migraines, Rx imitrex  . Fibroids, intramural 10/22/2011    History   Social History  . Marital Status: Single    Spouse Name: N/A  . Number of Children: 0  . Years of Education: N/A   Occupational History  . Mother [employer]     Social History Main Topics  . Smoking status: Never Smoker   . Smokeless tobacco: Never Used  . Alcohol Use: Yes     Comment: socially  . Drug Use: No  . Sexual Activity: Yes    Birth Control/ Protection: Pill   Other Topics Concern  . Not on file   Social History Narrative   Mom lives w/ her     Past Surgical History  Procedure Laterality Date  . Wisdom tooth extraction    . Laparotomy  10/23/2011    Procedure: EXPLORATORY LAPAROTOMY;  Surgeon: Thornell Sartorius, MD;  Location: [facility];  Service: Gynecology;  Laterality: N/A;  thru incision (laparotomy)/Myomyectomy    Family History  Problem Relation Age of Onset  . Coronary artery disease Other     GM in her 48s  . Diabetes Other     uncle  . Hypertension Mother     M, aunt, GM  . Leukemia Other     cousin  . Breast cancer Other     aunt  . Colon cancer Neg Hx   . Lung cancer Other     aunt, smoker    No Known Allergies  Current Outpatient Prescriptions on File Prior to Visit  Medication Sig Dispense Refill  . amLODipine (NORVASC) 10 MG tablet Take 0.5 tablets (5 mg total) by mouth daily. 30 tablet 6  . Norgestim-Eth Estrad Triphasic (ORTHO TRI-CYCLEN, 28, PO) Take 1 tablet by mouth daily.      No current facility-administered medications on file prior to visit.    BP 157/93 mmHg  Pulse 96  Temp(Src) 98.6 F (37 C) (Oral)  Ht 5' 7.1" (1.704 m)  Wt 189 lb 12.8 oz (86.093 kg)  BMI 29.65 kg/m2  SpO2 98%  LMP 12/08/2014      Objective:   Physical Exam  General  Mental Status - Alert. General Appearance - Well groomed. Not in acute distress.  Skin Rashes- No Rashes.  HEENT Head- Normal. Ear Auditory Canal - Left- Normal. Right - Normal.Tympanic Membrane- Left- Normal. Right- Normal. Eye Sclera/Conjunctiva- Left- Normal. Right- Normal. Nose & Sinuses Nasal Mucosa- Left-  Boggy and Congested. Right-  Boggy and  Congested.Faint maxillary and frontal sinus pressure. Mouth & Throat Lips: Upper Lip- Normal: no dryness, cracking, pallor, cyanosis, or vesicular eruption. Lower Lip-Normal: no dryness, cracking, pallor, cyanosis or vesicular eruption. Buccal Mucosa- Bilateral- No Aphthous ulcers. Oropharynx- No Discharge or Erythema. + pnd. Tonsils: Characteristics- Bilateral- No Erythema or Congestion.  Size/Enlargement- Bilateral- No enlargement. Discharge- bilateral-None.  Neck Neck- Supple. No Masses.   Chest and Lung Exam Auscultation: Breath Sounds:-Clear even and unlabored.  Cardiovascular Auscultation:Rythm- Regular, rate and rhythm. Murmurs & Other Heart Sounds:Ausculatation of the heart reveal- No Murmurs.  Lymphatic Head & Neck General Head & Neck Lymphatics: Bilateral: Description- No Localized lymphadenopathy.      Assessment & Plan:

## 2014-12-29 NOTE — Assessment & Plan Note (Signed)
Sinusitis following allergic rhinitis. But also possible bronchitis.  Cefdinir rx, nasonex rx, and benzonatate. (Can use allegra otc in place of claritin)  Can continue astelin.  Follow up in 7 days persisting symptoms or as needed worsening.

## 2015-01-10 NOTE — Telephone Encounter (Signed)
Patient notified Flonase called in and to check prices for OTC before filling

## 2015-01-16 ENCOUNTER — Encounter: Payer: Self-pay | Admitting: Internal Medicine

## 2015-01-16 ENCOUNTER — Other Ambulatory Visit: Payer: Self-pay

## 2015-01-16 ENCOUNTER — Ambulatory Visit (INDEPENDENT_AMBULATORY_CARE_PROVIDER_SITE_OTHER): Payer: BLUE CROSS/BLUE SHIELD | Admitting: Internal Medicine

## 2015-01-16 VITALS — BP 132/98 | HR 94 | Temp 98.0°F | Ht 67.1 in | Wt 190.0 lb

## 2015-01-16 DIAGNOSIS — I10 Essential (primary) hypertension: Secondary | ICD-10-CM | POA: Diagnosis not present

## 2015-01-16 MED ORDER — CARVEDILOL 6.25 MG PO TABS: 6.2500 mg | ORAL_TABLET | Freq: Two times a day (BID) | ORAL | Status: DC

## 2015-01-16 NOTE — Assessment & Plan Note (Addendum)
Diastolic BP slightly elevated for the last few weeks in the high 80s and 90s, patient concern, good compliance with amlodipine 5 mg. Previously, intolerant of higher doses of amlodipine due to edema. We discussed the role of diet, exercise, I encouraged her to work on that. We also talk about possibly add a medication  and she is in favor of that. Plan: Add carvedilol, low dose, monitor BPs, increase carvedilol dose if needed, follow-up in 3 months, see instructions

## 2015-01-16 NOTE — Progress Notes (Signed)
Pre visit review using our clinic review tool, if applicable. No additional management support is needed unless otherwise documented below in the visit note. 

## 2015-01-16 NOTE — Patient Instructions (Signed)
Continue amlodipine, start carvedilol  Check the  blood pressure 2 or 3 times a  Week   Be sure your blood pressure is between 110/65 and  145/85.  if it is consistently higher or lower, let me know   Come back in 3 months

## 2015-01-16 NOTE — Progress Notes (Signed)
Subjective:    Patient ID: Miranda [name], female    DOB: 02-22-1976, 39 y.o.   MRN: [id number]  DOS:  01/16/2015 Type of visit - description : acute, BP Interval history: The patient was seen 12-29-14 with sinusitis, at the time BP was elevated and she was recommended to check her BPs regularly. Since then she's doing that, systolic BP is usually okay between 120 and 130, DBPs are usually in the high 80s to 94. 4 days ago she had a ill-defined headache, global?, Not the worst of her life and it subsided spontaneously shortly after. She is here concerned about her slightly elevated BP.    Review of Systems Denies chest pain or difficulty breathing No anxiety or depression Some fatigue, occasionally snoring but not feeling sleepy. Sinusitis symptoms resolved Admits to mild increasing sodium intake last week, not taking NSAIDs on regular basis  Past Medical History  Diagnosis Date  . Allergic rhinitis   . Acne   . Hypertension   . Hemorrhoids   . HA (headache) 07/2008    MRI (-), MRA 2 anerurysms?------> CT angio (-); saw Neuro, likely migraines, Rx imitrex  . Fibroids, intramural 10/22/2011    Past Surgical History  Procedure Laterality Date  . Wisdom tooth extraction    . Laparotomy  10/23/2011    Procedure: EXPLORATORY LAPAROTOMY;  Surgeon: Thornell Sartorius, MD;  Location: [facility];  Service: Gynecology;  Laterality: N/A;  thru incision (laparotomy)/Myomyectomy    History   Social History  . Marital Status: Single    Spouse Name: N/A  . Number of Children: 0  . Years of Education: N/A   Occupational History  . Mother [employer]     Social History Main Topics  . Smoking status: Never Smoker   . Smokeless tobacco: Never Used  . Alcohol Use: Yes     Comment: socially  . Drug Use: No  . Sexual Activity: Yes    Birth Control/ Protection: Pill   Other Topics Concern  . Not on file   Social History Narrative   Mom lives w/ her        Medication List       This  list is accurate as of: 01/16/15 11:59 PM.  Always use your most recent med list.               amLODipine 10 MG tablet  Commonly known as:  NORVASC  Take 0.5 tablets (5 mg total) by mouth daily.     carvedilol 6.25 MG tablet  Commonly known as:  COREG  Take 1 tablet (6.25 mg total) by mouth 2 (two) times daily with a meal.     ORTHO TRI-CYCLEN (28) PO  Take 1 tablet by mouth daily.           Objective:   Physical Exam BP 132/98 mmHg  Pulse 94  Temp(Src) 98 F (36.7 C) (Oral)  Ht 5' 7.1" (1.704 m)  Wt 190 lb (86.183 kg)  BMI 29.68 kg/m2  SpO2 98%  LMP 01/08/2015 (Within Days) General:   Well developed, well nourished . NAD.  HEENT:  Normocephalic . Face symmetric, atraumatic Lungs:  CTA B Normal respiratory effort, no intercostal retractions, no accessory muscle use. Heart: RRR,  no murmur.  Muscle skeletal: no pretibial edema bilaterally  Skin: Not pale. Not jaundice Neurologic:  alert & oriented X3.  Speech normal, gait appropriate for age and unassisted Psych--  Cognition and judgment appear intact.  Cooperative with normal attention  span and concentration.  Behavior appropriate. No anxious or depressed appearing.      Assessment & Plan:

## 2015-01-24 ENCOUNTER — Other Ambulatory Visit: Payer: Self-pay | Admitting: Internal Medicine

## 2015-02-19 ENCOUNTER — Other Ambulatory Visit: Payer: Self-pay

## 2015-04-30 LAB — HM PAP SMEAR: HM PAP: NORMAL

## 2015-06-04 ENCOUNTER — Other Ambulatory Visit: Payer: Self-pay | Admitting: Internal Medicine

## 2015-06-05 ENCOUNTER — Other Ambulatory Visit: Payer: Self-pay | Admitting: Internal Medicine

## 2015-06-29 ENCOUNTER — Encounter: Payer: Self-pay | Admitting: Internal Medicine

## 2015-06-29 ENCOUNTER — Ambulatory Visit (INDEPENDENT_AMBULATORY_CARE_PROVIDER_SITE_OTHER): Payer: BLUE CROSS/BLUE SHIELD | Admitting: Internal Medicine

## 2015-06-29 VITALS — BP 116/78 | HR 81 | Temp 98.1°F | Ht 67.0 in | Wt 193.2 lb

## 2015-06-29 DIAGNOSIS — I10 Essential (primary) hypertension: Secondary | ICD-10-CM | POA: Diagnosis not present

## 2015-06-29 DIAGNOSIS — Z23 Encounter for immunization: Secondary | ICD-10-CM

## 2015-06-29 DIAGNOSIS — Z09 Encounter for follow-up examination after completed treatment for conditions other than malignant neoplasm: Secondary | ICD-10-CM | POA: Insufficient documentation

## 2015-06-29 DIAGNOSIS — Z114 Encounter for screening for human immunodeficiency virus [HIV]: Secondary | ICD-10-CM

## 2015-06-29 LAB — BASIC METABOLIC PANEL
BUN: 16 mg/dL (ref 6–23)
CALCIUM: 9.3 mg/dL (ref 8.4–10.5)
CO2: 28 meq/L (ref 19–32)
CREATININE: 0.85 mg/dL (ref 0.40–1.20)
Chloride: 104 mEq/L (ref 96–112)
GFR: 94.52 mL/min (ref >60.00)
Glucose, Bld: 88 mg/dL (ref 70–99)
Potassium: 3.7 mEq/L (ref 3.5–5.1)
Sodium: 138 mEq/L (ref 135–145)

## 2015-06-29 MED ORDER — AMLODIPINE BESYLATE 10 MG PO TABS: 5.0000 mg | ORAL_TABLET | Freq: Every day | ORAL | Status: DC

## 2015-06-29 MED ORDER — CARVEDILOL 6.25 MG PO TABS: 6.2500 mg | ORAL_TABLET | Freq: Two times a day (BID) | ORAL | Status: DC

## 2015-06-29 NOTE — Progress Notes (Signed)
Pre visit review using our clinic review tool, if applicable. No additional management support is needed unless otherwise documented below in the visit note. 

## 2015-06-29 NOTE — Patient Instructions (Signed)
Get your blood work before you leave     Next visit  for a  complete physical exam in 6 months    Please schedule an appointment at the front desk

## 2015-06-29 NOTE — Progress Notes (Signed)
Subjective:    Patient ID: Miranda [name], female    DOB: 31-Dec-1975, 39 y.o.   MRN: [id number]  DOS:  06/29/2015 Type of visit - description : Follow-up Interval history: Hypertension: Good compliance with medication, no apparent side effects, ambulatory BPs 120/80. Doing better with a low salt diet, has not been able to exercise mostly d/t her schedule at work   Review of Systems No chest pain or difficulty breathing No nausea, vomiting, diarrhea  Past Medical History  Diagnosis Date  . Allergic rhinitis   . Acne   . Hypertension   . Hemorrhoids   . HA (headache) 07/2008    MRI (-), MRA 2 anerurysms?------> CT angio (-); saw Neuro, likely migraines, Rx imitrex  . Fibroids, intramural 10/22/2011    Past Surgical History  Procedure Laterality Date  . Wisdom tooth extraction    . Laparotomy  10/23/2011    Procedure: EXPLORATORY LAPAROTOMY;  Surgeon: Thornell Sartorius, MD;  Location: [facility];  Service: Gynecology;  Laterality: N/A;  thru incision (laparotomy)/Myomyectomy    Social History   Social History  . Marital Status: Single    Spouse Name: N/A  . Number of Children: 0  . Years of Education: N/A   Occupational History  . Mother [employer]     Social History Main Topics  . Smoking status: Never Smoker   . Smokeless tobacco: Never Used  . Alcohol Use: Yes     Comment: socially  . Drug Use: No  . Sexual Activity: Yes    Birth Control/ Protection: Pill   Other Topics Concern  . Not on file   Social History Narrative   Mom lives w/ her        Medication List       This list is accurate as of: 06/29/15  6:03 PM.  Always use your most recent med list.               amLODipine 10 MG tablet  Commonly known as:  NORVASC  Take 0.5 tablets (5 mg total) by mouth daily.     carvedilol 6.25 MG tablet  Commonly known as:  COREG  Take 1 tablet (6.25 mg total) by mouth 2 (two) times daily with a meal.     ORTHO TRI-CYCLEN (28) PO  Take 1 tablet by mouth daily.            Objective:   Physical Exam BP 116/78 mmHg  Pulse 81  Temp(Src) 98.1 F (36.7 C) (Oral)  Ht 5\' 7"  (1.702 m)  Wt 193 lb 4 oz (87.658 kg)  BMI 30.26 kg/m2  SpO2 99%  LMP 06/15/2015 (Approximate) General:   Well developed, well nourished . NAD.  HEENT:  Normocephalic . Face symmetric, atraumatic Lungs:  CTA B Normal respiratory effort, no intercostal retractions, no accessory muscle use. Heart: RRR,  no murmur.  No pretibial edema bilaterally  Skin: Not pale. Not jaundice Neurologic:  alert & oriented X3.  Speech normal, gait appropriate for age and unassisted Psych--  Cognition and judgment appear intact.  Cooperative with normal attention span and concentration.  Behavior appropriate. No anxious or depressed appearing.      Assessment & Plan:   assessment > HTN Acne Headaches --- MRI (-), MRA 2 anerurysms?------> CT angio (-); saw Neuro, likely migraines, Rx imitrex Fibroid tumors  Plan HTN: On carvedilol and amlodipine, well-controlled, check a BMP, refill medications. Primary care: Flu shot today RTC 6 months  RTC 6 months for a  physical

## 2015-06-29 NOTE — Assessment & Plan Note (Signed)
HTN: On carvedilol and amlodipine, well-controlled, check a BMP, refill medications. Primary care: Flu shot today RTC 6 months

## 2015-06-30 LAB — HIV ANTIBODY (ROUTINE TESTING W REFLEX): HIV: NONREACTIVE

## 2015-07-02 ENCOUNTER — Telehealth: Payer: Self-pay | Admitting: Behavioral Health

## 2015-07-02 NOTE — Telephone Encounter (Signed)
Caller: Water quality scientist (self)  Reason for call: Reaction to the flu shot  Per the patient, she reported redness, warm touch, swelling and itching at the site where she received the flu vaccine, along with minor cold symptoms (e.g. sneezing, coughing & slight congestion). She voiced that she first noticed the symptoms on Saturday, the day after her shot was given. Patient  did not report any fever, chills, or aches. Informed patient to apply a cold pack or a wet washcloth with  ice to the site for 20 minutes, then repeat again in 1 hour and as needed for the first 24 hours. Also, gave the patient instructions to seek an urgent care or make an appointment with the office to be seen, if her symptoms have worsened or does not improve by the next day. She understood and did not have any further questions or concerns prior to the end of call.

## 2015-09-20 ENCOUNTER — Other Ambulatory Visit: Payer: Self-pay | Admitting: Internal Medicine

## 2015-10-08 ENCOUNTER — Other Ambulatory Visit: Payer: Self-pay

## 2015-10-08 MED ORDER — CARVEDILOL 6.25 MG PO TABS: 6.2500 mg | ORAL_TABLET | Freq: Two times a day (BID) | ORAL | Status: DC

## 2015-11-14 ENCOUNTER — Ambulatory Visit (INDEPENDENT_AMBULATORY_CARE_PROVIDER_SITE_OTHER): Payer: BLUE CROSS/BLUE SHIELD | Admitting: Internal Medicine

## 2015-11-14 ENCOUNTER — Encounter: Payer: Self-pay | Admitting: Internal Medicine

## 2015-11-14 ENCOUNTER — Other Ambulatory Visit: Payer: Self-pay

## 2015-11-14 VITALS — BP 122/72 | HR 77 | Temp 98.2°F | Ht 67.0 in | Wt 200.1 lb

## 2015-11-14 DIAGNOSIS — M545 Low back pain, unspecified: Secondary | ICD-10-CM

## 2015-11-14 DIAGNOSIS — Z09 Encounter for follow-up examination after completed treatment for conditions other than malignant neoplasm: Secondary | ICD-10-CM

## 2015-11-14 NOTE — Progress Notes (Signed)
Subjective:    Patient ID: Miranda [name], female    DOB: 06-01-1976, 39 y.o.   MRN: [id number]  DOS:  11/14/2015 Type of visit - description : Acute visit Interval history: Bilateral lower back pain, had a episode 2 weeks ago and then again yesterday. Ibuprofen is helping, today the pain has decreased to some extent. No radiation, no lower extremity paresthesias   Review of Systems Denies fever, chills or abdominal pain. No injury or over use of her back. No fall, she did get a new mattress about 3 weeks ago.   Past Medical History  Diagnosis Date  . Allergic rhinitis   . Acne   . Hypertension   . Hemorrhoids   . HA (headache) 07/2008    MRI (-), MRA 2 anerurysms?------> CT angio (-); saw Neuro, likely migraines, Rx imitrex  . Fibroids, intramural 10/22/2011    Past Surgical History  Procedure Laterality Date  . Wisdom tooth extraction    . Laparotomy  10/23/2011    Procedure: EXPLORATORY LAPAROTOMY;  Surgeon: Thornell Sartorius, MD;  Location: [facility];  Service: Gynecology;  Laterality: N/A;  thru incision (laparotomy)/Myomyectomy    Social History   Social History  . Marital Status: Single    Spouse Name: N/A  . Number of Children: 0  . Years of Education: N/A   Occupational History  . Mother [employer]     Social History Main Topics  . Smoking status: Never Smoker   . Smokeless tobacco: Never Used  . Alcohol Use: Yes     Comment: socially  . Drug Use: No  . Sexual Activity: Yes    Birth Control/ Protection: Pill   Other Topics Concern  . Not on file   Social History Narrative   Mom lives w/ her        Medication List       This list is accurate as of: 11/14/15 11:59 PM.  Always use your most recent med list.               amLODipine 10 MG tablet  Commonly known as:  NORVASC  TAKE 1/2 TABLET BY MOUTH EVERY DAY     carvedilol 6.25 MG tablet  Commonly known as:  COREG  Take 1 tablet (6.25 mg total) by mouth 2 (two) times daily with a meal.     ORTHO TRI-CYCLEN (28) PO  Take 1 tablet by mouth daily.           Objective:   Physical Exam BP 122/72 mmHg  Pulse 77  Temp(Src) 98.2 F (36.8 C) (Oral)  Ht 5\' 7"  (1.702 m)  Wt 200 lb 2 oz (90.776 kg)  BMI 31.34 kg/m2  SpO2 99%  LMP 11/04/2015 (Approximate) General:   Well developed, well nourished . NAD.  HEENT:  Normocephalic . Face symmetric, atraumatic MSK: Lower back normal to inspection, no TTP. Skin: Not pale. Not jaundice Neurologic:  alert & oriented X3.  Speech normal, gait appropriate for age and unassisted Straight leg test negative, DTRs and strength symmetric.   Psych--  Cognition and judgment appear intact.  Cooperative with normal attention span and concentration.  Behavior appropriate. No anxious or depressed appearing.      Assessment & Plan:   Assessment > HTN Acne Headaches --- MRI (-), MRA 2 anerurysms?------> CT angio (-); saw Neuro, likely migraines, Rx imitrex Fibroid tumors  PLAN: Back pain: No red flag symptoms, likely sprain, recommend conservative treatment with Tylenol, Motrin, GI precautions discussed, warm  compresses. Call if no better. Also self physical therapy, see instructions.

## 2015-11-14 NOTE — Progress Notes (Signed)
Pre visit review using our clinic review tool, if applicable. No additional management support is needed unless otherwise documented below in the visit note. 

## 2015-11-14 NOTE — Patient Instructions (Signed)
Warm compress   IBUPROFEN (Advil or Motrin) 200 mg 2 tablets every 6 hours as needed for pain.  Always take it with food because may cause gastritis and ulcers.  If you notice nausea, stomach pain, change in the color of stools --->  Stop the medicine and let us know  Tylenol  500 mg OTC 2 tabs a day every 8 hours as needed for pain  Call if no better in 2-3 weeks or if pain severe   [place] with information about home physical therapy for back pain: [email or web address]

## 2015-11-15 NOTE — Assessment & Plan Note (Signed)
  Back pain: No red flag symptoms, likely sprain, recommend conservative treatment with Tylenol, Motrin, GI precautions discussed, warm compresses. Call if no better. Also self physical therapy, see instructions.

## 2015-12-27 ENCOUNTER — Encounter: Payer: Self-pay | Admitting: Behavioral Health

## 2015-12-27 ENCOUNTER — Telehealth: Payer: Self-pay | Admitting: Behavioral Health

## 2015-12-27 NOTE — Telephone Encounter (Signed)
Pre-Visit Call completed with patient and chart updated.   Pre-Visit Info documented in Specialty Comments under [employer].    

## 2015-12-28 ENCOUNTER — Ambulatory Visit (INDEPENDENT_AMBULATORY_CARE_PROVIDER_SITE_OTHER): Payer: BLUE CROSS/BLUE SHIELD | Admitting: Internal Medicine

## 2015-12-28 ENCOUNTER — Encounter: Payer: Self-pay | Admitting: Internal Medicine

## 2015-12-28 VITALS — BP 122/78 | HR 81 | Temp 98.6°F | Ht 67.0 in | Wt 202.0 lb

## 2015-12-28 DIAGNOSIS — Z0000 Encounter for general adult medical examination without abnormal findings: Secondary | ICD-10-CM

## 2015-12-28 DIAGNOSIS — Z09 Encounter for follow-up examination after completed treatment for conditions other than malignant neoplasm: Secondary | ICD-10-CM

## 2015-12-28 LAB — CBC WITH DIFFERENTIAL/PLATELET
Basophils Absolute: 0.1 10*3/uL (ref 0.0–0.1)
Basophils Relative: 1 % (ref 0–1)
EOS ABS: 0.1 10*3/uL (ref 0.0–0.7)
EOS PCT: 1 % (ref 0–5)
HCT: 37.7 % (ref 36.0–46.0)
Hemoglobin: 12.7 g/dL (ref 12.0–15.0)
Lymphocytes Relative: 41 % (ref 12–46)
Lymphs Abs: 2.7 10*3/uL (ref 0.7–4.0)
MCH: 28.7 pg (ref 26.0–34.0)
MCHC: 33.7 g/dL (ref 30.0–36.0)
MCV: 85.3 fL (ref 78.0–100.0)
MPV: 10.1 fL (ref 8.6–12.4)
Monocytes Absolute: 0.8 10*3/uL (ref 0.1–1.0)
Monocytes Relative: 12 % (ref 3–12)
Neutro Abs: 3 10*3/uL (ref 1.7–7.7)
Neutrophils Relative %: 45 % (ref 43–77)
PLATELETS: 374 10*3/uL (ref 150–400)
RBC: 4.42 MIL/uL (ref 3.87–5.11)
RDW: 14.4 % (ref 11.5–15.5)
WBC: 6.7 10*3/uL (ref 4.0–10.5)

## 2015-12-28 MED ORDER — AZELASTINE HCL 0.1 % NA SOLN: 2.0000 | Freq: Every evening | NASAL | Status: DC | PRN

## 2015-12-28 NOTE — Assessment & Plan Note (Addendum)
Td 08  female care: sees gyn, get MMGs there  Labs: CBC, BMP, LP Diet exercise discussed

## 2015-12-28 NOTE — Progress Notes (Signed)
Pre visit review using our clinic review tool, if applicable. No additional management support is needed unless otherwise documented below in the visit note. 

## 2015-12-28 NOTE — Progress Notes (Signed)
Subjective:    Patient ID: Miranda [name], female    DOB: 1976-06-07, 40 y.o.   MRN: [id number]  DOS:  12/28/2015 Type of visit - description : CPX Interval history: Feeling well, no major concerns  Review of Systems  Constitutional: No fever. No chills. No unexplained wt changes. No unusual sweats  HEENT: No dental problems, no ear discharge, no facial swelling, no voice changes. No eye discharge, no eye  redness , no  intolerance to light   Respiratory: No wheezing , no  difficulty breathing. No cough , no mucus production  Cardiovascular: No CP, no leg swelling , no  Palpitations  GI: no nausea, no vomiting, no diarrhea , no  abdominal pain.  No blood in the stools. No dysphagia, no odynophagia    Endocrine: No polyphagia, no polyuria , no polydipsia  GU: No dysuria, gross hematuria, difficulty urinating. No urinary urgency, no frequency.  Musculoskeletal: No joint swellings or unusual aches or pains  Skin: No change in the color of the skin, palor , no  Rash  Allergic, immunologic: Allergies needs better control, currently on Allegra  Neurological: No dizziness no  syncope. No headaches. No diplopia, no slurred, no slurred speech, no motor deficits, no facial  Numbness  Hematological: No enlarged lymph nodes, no easy bruising , no unusual bleedings  Psychiatry: No suicidal ideas, no hallucinations, no beavior problems, no confusion.  No unusual/severe anxiety, no depression  Past Medical History  Diagnosis Date  . Allergic rhinitis   . Acne   . Hypertension   . Hemorrhoids   . HA (headache) 07/2008    MRI (-), MRA 2 anerurysms?------> CT angio (-); saw Neuro, likely migraines, Rx imitrex  . Fibroids, intramural 10/22/2011    Past Surgical History  Procedure Laterality Date  . Wisdom tooth extraction    . Laparotomy  10/23/2011    Procedure: EXPLORATORY LAPAROTOMY;  Surgeon: Thornell Sartorius, MD;  Location: [facility];  Service: Gynecology;  Laterality: N/A;  thru  incision (laparotomy)/Myomyectomy    Social History   Social History  . Marital Status: Single    Spouse Name: N/A  . Number of Children: 0  . Years of Education: N/A   Occupational History  . Mother [employer]     Social History Main Topics  . Smoking status: Never Smoker   . Smokeless tobacco: Never Used  . Alcohol Use: Yes     Comment: socially  . Drug Use: No  . Sexual Activity: Yes    Birth Control/ Protection: Pill   Other Topics Concern  . Not on file   Social History Narrative   Lives by herself      Family History  Problem Relation Age of Onset  . Coronary artery disease Other     GM in her 78s  . Diabetes Other     uncle  . Hypertension Mother     M, aunt, GM  . Leukemia Other     cousin  . Breast cancer Other     aunt  . Colon cancer Neg Hx   . Lung cancer Other     aunt, smoker  . Cancer Maternal Aunt     Lung Cancer  . Cancer Maternal Uncle     Lung Cancer       Medication List       This list is accurate as of: 12/28/15  7:00 PM.  Always use your most recent med list.  ALLEGRA PO  Take by mouth daily as needed.     amLODipine 10 MG tablet  Commonly known as:  NORVASC  TAKE 1/2 TABLET BY MOUTH EVERY DAY     azelastine 0.1 % nasal spray  Commonly known as:  ASTELIN  Place 2 sprays into both nostrils at bedtime as needed for rhinitis. Use in each nostril as directed     carvedilol 6.25 MG tablet  Commonly known as:  COREG  Take 1 tablet (6.25 mg total) by mouth 2 (two) times daily with a meal.     ORTHO TRI-CYCLEN (28) PO  Take 1 tablet by mouth daily.           Objective:   Physical Exam BP 122/78 mmHg  Pulse 81  Temp(Src) 98.6 F (37 C) (Oral)  Ht 5\' 7"  (1.702 m)  Wt 202 lb (91.627 kg)  BMI 31.63 kg/m2  SpO2 99%  LMP 12/03/2015 (Approximate)  General:   Well developed, well nourished . NAD.  Neck: No  thyromegaly , normal carotid pulse HEENT:  Normocephalic . Face symmetric, atraumatic Lungs:   CTA B Normal respiratory effort, no intercostal retractions, no accessory muscle use. Heart: RRR,  no murmur.  No pretibial edema bilaterally  Abdomen:  Not distended, soft, non-tender. No rebound or rigidity.   Skin: Exposed areas without rash. Not pale. Not jaundice Neurologic:  alert & oriented X3.  Speech normal, gait appropriate for age and unassisted Strength symmetric and appropriate for age.  Psych: Cognition and judgment appear intact.  Cooperative with normal attention span and concentration.  Behavior appropriate. No anxious or depressed appearing.    Assessment & Plan:    Assessment > HTN Acne Headaches --- MRI (-), MRA 2 anerurysms?------> CT angio (-); saw Neuro, likely migraines, Rx imitrex Fibroid tumors  PLAN: HTN: On amlodipine, well controlled, no change Headaches, not an issue at this time Back pain: Resolved Allergies: Needs better control. Switch Allegra to Claritin for now, use Astelin and Flonase as needed throughout the year. RTC one year

## 2015-12-28 NOTE — Assessment & Plan Note (Signed)
HTN: On amlodipine, well controlled, no change Headaches, not an issue at this time Back pain: Resolved Allergies: Needs better control. Switch Allegra to Claritin for now, use Astelin and Flonase as needed throughout the year. RTC one year

## 2015-12-28 NOTE — Patient Instructions (Signed)
GO TO THE LAB :      Get the blood work     GO TO THE FRONT DESK Schedule your next appointment for a  physical When?   1 year Fasting?  Yes    Check the  blood pressure monthly  Be sure your blood pressure is between 110/65 and  145/85. If it is consistently higher or lower, let me know    Allergies  claritin Use OTC Nasocort or Flonase : 2 nasal sprays on each side of the nose in the morning until you feel better Use ASTELIN a prescribed spray : 2 nasal sprays on each side of the nose at night until you feel better

## 2015-12-29 LAB — BASIC METABOLIC PANEL
BUN: 14 mg/dL (ref 7–25)
CALCIUM: 9.3 mg/dL (ref 8.6–10.2)
CO2: 24 mmol/L (ref 20–31)
Chloride: 106 mmol/L (ref 98–110)
Creat: 0.82 mg/dL (ref 0.50–1.10)
GLUCOSE: 79 mg/dL (ref 65–99)
Potassium: 4.1 mmol/L (ref 3.5–5.3)
Sodium: 139 mmol/L (ref 135–146)

## 2015-12-29 LAB — LIPID PANEL
CHOLESTEROL: 174 mg/dL (ref 125–200)
HDL: 47 mg/dL (ref >=46)
LDL Cholesterol: 112 mg/dL (ref <130)
Total CHOL/HDL Ratio: 3.7 Ratio (ref <=5.0)
Triglycerides: 74 mg/dL (ref <150)
VLDL: 15 mg/dL (ref <30)

## 2016-03-10 ENCOUNTER — Encounter: Payer: Self-pay | Admitting: Internal Medicine

## 2016-03-10 ENCOUNTER — Ambulatory Visit (INDEPENDENT_AMBULATORY_CARE_PROVIDER_SITE_OTHER): Payer: BLUE CROSS/BLUE SHIELD | Admitting: Internal Medicine

## 2016-03-10 VITALS — BP 122/74 | HR 73 | Temp 98.5°F | Ht 67.0 in | Wt 201.2 lb

## 2016-03-10 DIAGNOSIS — R55 Syncope and collapse: Secondary | ICD-10-CM | POA: Diagnosis not present

## 2016-03-10 NOTE — Progress Notes (Signed)
Subjective:    Patient ID: Miranda [name], female    DOB: [birth year], 40 y.o.   MRN: [id number]  DOS:  03/10/2016 Type of visit - description : Acute visit Interval history: Patient had a syncope. She was in a ship at the port, in a very hot environment, ++s sweating, she had one alcoholic beverage . She ate a taco, did not finish, it was too salty. She was sitting waiting for a safety breafing and started to feel lightheaded, and she passed out briefly. She was carried outside by bystanders to a cooler environment, no paramedics at the scene. No seizure activity, bladder or bowel incontinence. Drunk cool fluids and shortly after the she felt better. After that she went to her cabin to rest and the trip was uneventful afterwards. At the time did not have chest pain, difficulty breathing or palpitations. No diarrhea No anxiety per se. A mild headache but nothing that got her attention    Review of Systems See above  Past Medical History  Diagnosis Date  . Allergic rhinitis   . Acne   . Hypertension   . Hemorrhoids   . HA (headache) 07/2008    MRI (-), MRA 2 anerurysms?------> CT angio (-); saw Neuro, likely migraines, Rx imitrex  . Fibroids, intramural 10/22/2011    Past Surgical History  Procedure Laterality Date  . Wisdom tooth extraction    . Laparotomy  10/23/2011    Procedure: EXPLORATORY LAPAROTOMY;  Surgeon: Thornell Sartorius, MD;  Location: [facility];  Service: Gynecology;  Laterality: N/A;  thru incision (laparotomy)/Myomyectomy    Social History   Social History  . Marital Status: Single    Spouse Name: N/A  . Number of Children: 0  . Years of Education: N/A   Occupational History  . Mother [employer]     Social History Main Topics  . Smoking status: Never Smoker   . Smokeless tobacco: Never Used  . Alcohol Use: Yes     Comment: socially  . Drug Use: No  . Sexual Activity: Yes    Birth Control/ Protection: Pill   Other Topics Concern  . Not on file    Social History Narrative   Lives by herself         Medication List       This list is accurate as of: 03/10/16  4:47 PM.  Always use your most recent med list.               ALLEGRA PO  Take by mouth daily as needed.     amLODipine 10 MG tablet  Commonly known as:  NORVASC  TAKE 1/2 TABLET BY MOUTH EVERY DAY     azelastine 0.1 % nasal spray  Commonly known as:  ASTELIN  Place 2 sprays into both nostrils at bedtime as needed for rhinitis. Use in each nostril as directed     carvedilol 6.25 MG tablet  Commonly known as:  COREG  Take 1 tablet (6.25 mg total) by mouth 2 (two) times daily with a meal.     ORTHO TRI-CYCLEN (28) PO  Take 1 tablet by mouth daily.           Objective:   Physical Exam BP 122/74 mmHg  Pulse 73  Temp(Src) 98.5 F (36.9 C) (Oral)  Ht 5\' 7"  (1.702 m)  Wt 201 lb 4 oz (91.286 kg)  BMI 31.51 kg/m2  SpO2 99%  LMP 03/03/2016 (Approximate)  General:   Well developed, well nourished .  NAD.  Neck: Normal carotid pulses HEENT:  Normocephalic . Face symmetric, atraumatic Lungs:  CTA B Normal respiratory effort, no intercostal retractions, no accessory muscle use. Heart: RRR,  no murmur.  No pretibial edema bilaterally  Abdomen:  Not distended, soft, non-tender. No rebound or rigidity.   Skin: Exposed areas without rash. Not pale. Not jaundice Neurologic:  alert & oriented X3.  Speech normal, gait appropriate for age and unassisted Strength symmetric and appropriate for age. EOMI, DTRs symmetric. Psych: Cognition and judgment appear intact.  Cooperative with normal attention span and concentration.  Behavior appropriate. No anxious or depressed appearing.    Assessment & Plan:   Assessment > HTN Acne Headaches --- MRI (-), MRA 2 anerurysms?------> CT angio (-); saw Neuro, likely migraines, Rx imitrex Syncope-- 01-2014, 02-2016 (likely vaso-vagal)  Fibroid tumors  PLAN: Syncope: Similar symptoms 2 years ago under same  circumstances (hot environment, sweaty). No red flag symptoms, likely a vaso-vagal. For now recommend observation, patient agrees, will try to stay well-hydrated this summer. RTC 3 months, sooner if she has any other problems.

## 2016-03-10 NOTE — Assessment & Plan Note (Signed)
Syncope: Similar symptoms 2 years ago under same circumstances (hot environment, sweaty). No red flag symptoms, likely a vaso-vagal. For now recommend observation, patient agrees, will try to stay well-hydrated this summer. RTC 3 months, sooner if she has any other problems.

## 2016-03-10 NOTE — Patient Instructions (Signed)
  GO TO THE FRONT DESK Schedule your next appointment for a  Check up in 3 months  

## 2016-03-10 NOTE — Progress Notes (Signed)
Pre visit review using our clinic review tool, if applicable. No additional management support is needed unless otherwise documented below in the visit note. 

## 2016-05-14 LAB — HM PAP SMEAR

## 2016-05-19 LAB — HM MAMMOGRAPHY

## 2016-06-11 ENCOUNTER — Ambulatory Visit (INDEPENDENT_AMBULATORY_CARE_PROVIDER_SITE_OTHER): Payer: BLUE CROSS/BLUE SHIELD | Admitting: Internal Medicine

## 2016-06-11 ENCOUNTER — Encounter: Payer: Self-pay | Admitting: Internal Medicine

## 2016-06-11 VITALS — BP 124/80 | HR 70 | Temp 98.3°F | Resp 14 | Ht 67.0 in | Wt 199.0 lb

## 2016-06-11 DIAGNOSIS — R42 Dizziness and giddiness: Secondary | ICD-10-CM

## 2016-06-11 DIAGNOSIS — R5383 Other fatigue: Secondary | ICD-10-CM

## 2016-06-11 DIAGNOSIS — M79672 Pain in left foot: Secondary | ICD-10-CM

## 2016-06-11 DIAGNOSIS — I10 Essential (primary) hypertension: Secondary | ICD-10-CM

## 2016-06-11 LAB — TSH: TSH: 1.38 u[IU]/mL (ref 0.35–4.50)

## 2016-06-11 NOTE — Patient Instructions (Signed)
GO TO THE LAB : Get the blood work     GO TO THE FRONT DESK Schedule your next appointment for a  Physical exam in 6 months   

## 2016-06-11 NOTE — Progress Notes (Signed)
Pre visit review using our clinic review tool, if applicable. No additional management support is needed unless otherwise documented below in the visit note. 

## 2016-06-11 NOTE — Progress Notes (Signed)
Subjective:    Patient ID: Miranda [name], female    DOB: 05-17-1976, 40 y.o.   MRN: [id number]  DOS:  06/11/2016 Type of visit - description : Routine checkup Interval history: HTN: Good compliance w/ medication, BP today is excellent. No further syncope but has felt lightheaded for a few seconds two time since the last visit, request further labs. Having left foot pain on and off mostly at the arch. No swelling, request a referral to see a podiatrist.   Review of Systems Denies chest pain, palpitations. Currently without headaches No nausea, vomiting, diarrhea  Past Medical History:  Diagnosis Date  . Acne   . Allergic rhinitis   . Fibroids, intramural 10/22/2011  . HA (headache) 07/2008   MRI (-), MRA 2 anerurysms?------> CT angio (-); saw Neuro, likely migraines, Rx imitrex  . Hemorrhoids   . Hypertension     Past Surgical History:  Procedure Laterality Date  . LAPAROTOMY  10/23/2011   Procedure: EXPLORATORY LAPAROTOMY;  Surgeon: Thornell Sartorius, MD;  Location: [facility];  Service: Gynecology;  Laterality: N/A;  thru incision (laparotomy)/Myomyectomy  . WISDOM TOOTH EXTRACTION      Social History   Social History  . Marital status: Single    Spouse name: N/A  . Number of children: 0  . Years of education: N/A   Occupational History  . Mother [employer]     Social History Main Topics  . Smoking status: Never Smoker  . Smokeless tobacco: Never Used  . Alcohol use Yes     Comment: socially  . Drug use: No  . Sexual activity: Yes    Birth control/ protection: Pill   Other Topics Concern  . Not on file   Social History Narrative   Lives by herself         Medication List       Accurate as of 06/11/16 11:59 PM. Always use your most recent med list.          ALLEGRA PO Take by mouth daily as needed.   amLODipine 10 MG tablet Commonly known as:  NORVASC TAKE 1/2 TABLET BY MOUTH EVERY DAY   azelastine 0.1 % nasal spray Commonly known as:   ASTELIN Place 2 sprays into both nostrils at bedtime as needed for rhinitis. Use in each nostril as directed   carvedilol 6.25 MG tablet Commonly known as:  COREG Take 1 tablet (6.25 mg total) by mouth 2 (two) times daily with a meal.   ORTHO TRI-CYCLEN (28) PO Take 1 tablet by mouth daily.          Objective:   Physical Exam BP 124/80 (BP Location: Left Arm, Patient Position: Sitting, Cuff Size: Normal)   Pulse 70   Temp 98.3 F (36.8 C) (Oral)   Resp 14   Ht 5\' 7"  (1.702 m)   Wt 199 lb (90.3 kg)   LMP 05/18/2016 (Approximate)   SpO2 98%   BMI 31.17 kg/m  General:   Well developed, well nourished . NAD.  HEENT:  Normocephalic . Face symmetric, atraumatic Lungs:  CTA B Normal respiratory effort, no intercostal retractions, no accessory muscle use. Heart: RRR,  no murmur.  No pretibial edema bilaterally  Skin: Not pale. Not jaundice Neurologic:  alert & oriented X3.  Speech normal, gait appropriate for age and unassisted Psych--  Cognition and judgment appear intact.  Cooperative with normal attention span and concentration.  Behavior appropriate. No anxious or depressed appearing.  Assessment & Plan:    Assessment > HTN Acne Headaches --- MRI (-), MRA 2 anerurysms?------> CT angio (-); saw Neuro, likely migraines, Rx imitrex Syncope-- 01-2014, 02-2016 (likely vaso-vagal)  Fibroid tumors  PLAN: HTN: Controlled with amlodipine and carvedilol. Syncope: No further episodes Lightheadedness: She did have a couple of episode of lightheadedness and fatigue. Will check a TSH and vitamin D otherwise recommend observation. Left foot pain: Refer to podiatry RTC 6 months CPX

## 2016-06-12 NOTE — Assessment & Plan Note (Signed)
HTN: Controlled with amlodipine and carvedilol. Syncope: No further episodes Lightheadedness: She did have a couple of episode of lightheadedness and fatigue. Will check a TSH and vitamin D otherwise recommend observation. Left foot pain: Refer to podiatry RTC 6 months CPX

## 2016-06-13 LAB — VITAMIN D 1,25 DIHYDROXY
Vitamin D 1, 25 (OH)2 Total: 77 pg/mL — ABNORMAL HIGH (ref 18–72)
Vitamin D3 1, 25 (OH)2: 77 pg/mL

## 2016-06-16 ENCOUNTER — Encounter: Payer: Self-pay | Admitting: Internal Medicine

## 2016-06-17 ENCOUNTER — Encounter: Payer: Self-pay | Admitting: Podiatry

## 2016-06-17 ENCOUNTER — Ambulatory Visit (INDEPENDENT_AMBULATORY_CARE_PROVIDER_SITE_OTHER): Payer: BLUE CROSS/BLUE SHIELD | Admitting: Podiatry

## 2016-06-17 ENCOUNTER — Ambulatory Visit (HOSPITAL_BASED_OUTPATIENT_CLINIC_OR_DEPARTMENT_OTHER)
Admission: RE | Admit: 2016-06-17 | Discharge: 2016-06-17 | Disposition: A | Discharge status: Home / Self Care | Payer: BLUE CROSS/BLUE SHIELD | Source: Ambulatory Visit | Attending: Podiatry | Admitting: Podiatry

## 2016-06-17 DIAGNOSIS — R52 Pain, unspecified: Secondary | ICD-10-CM

## 2016-06-17 DIAGNOSIS — M79673 Pain in unspecified foot: Secondary | ICD-10-CM

## 2016-06-17 DIAGNOSIS — M79672 Pain in left foot: Secondary | ICD-10-CM | POA: Insufficient documentation

## 2016-06-17 DIAGNOSIS — M722 Plantar fascial fibromatosis: Secondary | ICD-10-CM | POA: Diagnosis not present

## 2016-06-17 MED ORDER — MELOXICAM 15 MG PO TABS
15.0000 mg | ORAL_TABLET | Freq: Every day | ORAL | 2 refills | Status: DC
Start: 2016-06-17 — End: 2016-08-11

## 2016-06-17 NOTE — Patient Instructions (Signed)

## 2016-06-17 NOTE — Progress Notes (Signed)
Subjective:     Patient ID: Miranda [name], female   DOB: May 23, 1976, 40 y.o.   MRN: [id number]  HPI 40 year old female presents the office of consent left heel pain and pain in the arch the foot which is been ongoing for 1 month and she describes as a nagging a to her foot.She states that she has pain when she first gets up and throughout the day she has pain to the arch of her feet. 38 of experiences is within the arch. She's had no recent treatment for this. Denies any recent injury or trauma. No swelling or redness. No numbness or tingling. The pain does not wake her up at night. No other complaints today.  Review of Systems  All other systems reviewed and are negative.      Objective:   Physical Exam General: AAO x3, NAD  Dermatological: Skin is warm, dry and supple bilateral. Nails x 10 are well manicured; remaining integument appears unremarkable at this time. There are no open sores, no preulcerative lesions, no rash or signs of infection present.  Vascular: Dorsalis Pedis artery and Posterior Tibial artery pedal pulses are 2/4 bilateral with immedate capillary fill time. Pedal hair growth present. No varicosities and no lower extremity edema present bilateral. There is no pain with calf compression, swelling, warmth, erythema.   Neruologic: Grossly intact via light touch bilateral. Vibratory intact via tuning fork bilateral. Protective threshold with Semmes Wienstein monofilament intact to all pedal sites bilateral. Patellar and Achilles deep tendon reflexes 2+ bilateral. No Babinski or clonus noted bilateral.   Musculoskeletal: There is no significant tenderness to palpation along the plantar medial tubercle of the calcaneus at the insertion of plantar fascia on thel left or right foot. There is pain along the course of the plantar fascia within the arch of the foot on the left along the medial band of the plantar fascia. Plantar fascia appears to be intact. There is no pain with  lateral compression of the calcaneus or pain with vibratory sensation. There is no pain along the course or insertion of the achilles tendon. No other areas of tenderness to bilateral lower extremities.  Gait: Unassisted, Nonantalgic.      Assessment:     Left arch pain; plantar fasciitis    Plan:     -Treatment options discussed including all alternatives, risks, and complications -Etiology of symptoms were discussed -X-rays were reviewed with the patient. -Hold off on steroid injection today is the majority of her symptoms were within the arch of the foot. -Prescribed mobic. Discussed side effects of the medication and directed to stop if any are to occur and call the office.  -Plantar fascial brace dispensed -Discussed shoe gear modifications and orthotics. She was scanned for orthotics & sent to [facility] labs. -Stretching and icing exercises daily -Follow-up in 3-4 weeks or sooner if any problems arise. In the meantime, encouraged to call the office with any questions, concerns, change in symptoms.   Celesta Gentile, DPM

## 2016-07-03 ENCOUNTER — Encounter: Payer: Self-pay | Admitting: Medical

## 2016-07-03 ENCOUNTER — Ambulatory Visit (INDEPENDENT_AMBULATORY_CARE_PROVIDER_SITE_OTHER): Payer: BLUE CROSS/BLUE SHIELD | Admitting: Medical

## 2016-07-03 VITALS — BP 140/92 | HR 77 | Temp 98.5°F | Ht 67.0 in | Wt 202.4 lb

## 2016-07-03 DIAGNOSIS — J3489 Other specified disorders of nose and nasal sinuses: Secondary | ICD-10-CM | POA: Diagnosis not present

## 2016-07-03 DIAGNOSIS — R591 Generalized enlarged lymph nodes: Secondary | ICD-10-CM | POA: Diagnosis not present

## 2016-07-03 MED ORDER — AMOXICILLIN-POT CLAVULANATE 875-125 MG PO TABS: 1.0000 | ORAL_TABLET | Freq: Two times a day (BID) | ORAL | 0 refills | Status: DC

## 2016-07-03 NOTE — Patient Instructions (Addendum)
For you  enlarged lymph node will rx augmentin antibiotic. Please start tonight. Also please return tomorrow morning for blood work since our lab is closed currently. If this area enlarged by tomorrow will go ahead and get [place] of area as we approach weekend.   Cause of this swelling is undetermined. Maybe regional infection but not obvious on exam. We need to follow you closely and see how you respond to antibiotic.  If area not improving may need evaluation by ENT MD and or dentist.  I do want you to use lozenge or hard candy over weekend to stimulate your salivary gland as this may help.  Follow up on Monday. If over weekend the area is worsening then ED evaluation.  Start both your astelin spray and your flonase spray. Augmentin antibiotic can help with sinus infection as well.

## 2016-07-03 NOTE — Progress Notes (Signed)
Pre visit review using our clinic tool,if applicable. No additional management support is needed unless otherwise documented below in the visit note.  

## 2016-07-03 NOTE — Progress Notes (Signed)
Subjective:    Patient ID: Miranda [name], female    DOB: [birth year], 40 y.o.   MRN: [id number]  HPI   Pt in with some recent jaw area swelling on Tuesday. The area does hurt on movement and palpation. No pain in her teeth. No ear pain. No fevers, no chills or sweats.   Pt had some sinus congestion and some sneezing recently last couple of days. Some pnd. She described allergy symptoms that started 10 days ago.    LMP- 2wks ago. Came when she expected it to.     Review of Systems  Constitutional: Negative for chills, fatigue and fever.  HENT: Positive for congestion, postnasal drip, sinus pressure and sneezing. Negative for ear pain, tinnitus, trouble swallowing and voice change.   Respiratory: Negative for cough, chest tightness, shortness of breath and wheezing.   Cardiovascular: Negative for chest pain and palpitations.  Gastrointestinal: Negative for abdominal pain.  Musculoskeletal: Negative for back pain.  Skin: Negative for rash.  Neurological: Negative for dizziness, weakness and headaches.  Hematological: Positive for adenopathy.  Psychiatric/Behavioral: Negative for behavioral problems and confusion.    Past Medical History:  Diagnosis Date  . Acne   . Allergic rhinitis   . Fibroids, intramural 10/22/2011  . HA (headache) 07/2008   MRI (-), MRA 2 anerurysms?------> CT angio (-); saw Neuro, likely migraines, Rx imitrex  . Hemorrhoids   . Hypertension      Social History   Social History  . Marital status: Single    Spouse name: N/A  . Number of children: 0  . Years of education: N/A   Occupational History  . Mother [employer]     Social History Main Topics  . Smoking status: Never Smoker  . Smokeless tobacco: Never Used  . Alcohol use Yes     Comment: socially  . Drug use: No  . Sexual activity: Yes    Birth control/ protection: Pill   Other Topics Concern  . Not on file   Social History Narrative   Lives by herself     Past Surgical  History:  Procedure Laterality Date  . LAPAROTOMY  10/23/2011   Procedure: EXPLORATORY LAPAROTOMY;  Surgeon: Thornell Sartorius, MD;  [facility];  Service: Gynecology;  Laterality: N/A;  thru incision (laparotomy)/Myomyectomy  . WISDOM TOOTH EXTRACTION      Family History  Problem Relation Age of Onset  . Coronary artery disease Other     GM in her 21s  . Diabetes Other     uncle  . Hypertension Mother     M, aunt, GM  . Leukemia Other     cousin  . Breast cancer Other     aunt  . Lung cancer Other     aunt, smoker  . Cancer Maternal Aunt     Lung Cancer  . Cancer Maternal Uncle     Lung Cancer  . Colon cancer Neg Hx     No Known Allergies  Current Outpatient Prescriptions on File Prior to Visit  Medication Sig Dispense Refill  . amLODipine (NORVASC) 10 MG tablet TAKE 1/2 TABLET BY MOUTH EVERY DAY 15 tablet 6  . azelastine (ASTELIN) 0.1 % nasal spray Place 2 sprays into both nostrils at bedtime as needed for rhinitis. Use in each nostril as directed 30 mL 6  . carvedilol (COREG) 6.25 MG tablet Take 1 tablet (6.25 mg total) by mouth 2 (two) times daily with a meal. 180 tablet 2  . Fexofenadine  HCl (ALLEGRA PO) Take by mouth daily as needed.    . meloxicam (MOBIC) 15 MG tablet Take 1 tablet (15 mg total) by mouth daily. 30 tablet 2  . Norgestim-Eth Estrad Triphasic (ORTHO TRI-CYCLEN, 28, PO) Take 1 tablet by mouth daily.      No current facility-administered medications on file prior to visit.     BP (!) 140/92   Pulse 77   Temp 98.5 F (36.9 C) (Oral)   Ht 5\' 7"  (1.702 m)   Wt 202 lb 6.4 oz (91.8 kg)   LMP 06/30/2016   SpO2 100%   BMI 31.70 kg/m       Objective:   Physical Exam  General  Mental Status - Alert. General Appearance - Well groomed. Not in acute distress.  Skin Rashes- No Rashes.  HEENT Head- Normal. Ear Auditory Canal - Left- Normal. Right - Normal.Tympanic Membrane- Left- Normal. Right- Normal. Eye Sclera/Conjunctiva- Left- Normal.  Right- Normal. Nose & Sinuses Nasal Mucosa- Left-  Boggy and Congested. Right-  Boggy and  Congested.Bilateral faint pressure maxillary but no  frontal sinus pressure. Mouth & Throat Lips: Upper Lip- Normal: no dryness, cracking, pallor, cyanosis, or vesicular eruption. Lower Lip-Normal: no dryness, cracking, pallor, cyanosis or vesicular eruption. Buccal Mucosa- Bilateral- No Aphthous ulcers. Oropharynx- No Discharge or Erythema. +pnd Tonsils: Characteristics- Bilateral- No Erythema or Congestion. Size/Enlargement- Bilateral- No enlargement. Discharge- bilateral-None. Mouth- no obvious caries seen left lower teeth. Mandilble- no direct tenderness. Parotid gland does not feel swollen.  Neck Neck- Supple. No Masses. Moderate sized left submandibular node about  1.5cm- 2 cm size. Moderate tender.   Chest and Lung Exam Auscultation: Breath Sounds:-Clear even and unlabored.  Cardiovascular Auscultation:Rythm- Regular, rate and rhythm. Murmurs & Other Heart Sounds:Ausculatation of the heart reveal- No Murmurs.  Lymphatic Head & Neck General Head & Neck Lymphatics: Bilateral: Description- see  Neck exam.  Skin- redness or warmth to skin over neck or mandible. No induraction.      Assessment & Plan:  For you  enlarged lymph node will rx augmentin antibiotic. Please start tonight. Also please return tomorrow morning for blood work since our lab is closed currently. If this area enlarged by tomorrow will go ahead and get [place] of area as we approach weekend.   Cause of this swelling is undetermined. Maybe regional infection but not obvious on exam. We need to follow you closely and see how you respond to antibiotic.  If area not improving may need evaluation by ENT MD and or dentist.  I do want you to use lozenge or hard candy over weekend to stimulate your salivary gland as this may help.  Follow up on Monday. If over weekend the area is worsening then ED evaluation.  Start both your  astelin spray and your flonase spray. Augmentin antibiotic can help with sinus infection as well.  Angelika Jerrett, [employer], PA-C

## 2016-07-04 ENCOUNTER — Other Ambulatory Visit (INDEPENDENT_AMBULATORY_CARE_PROVIDER_SITE_OTHER): Payer: BLUE CROSS/BLUE SHIELD

## 2016-07-04 DIAGNOSIS — R591 Generalized enlarged lymph nodes: Secondary | ICD-10-CM | POA: Diagnosis not present

## 2016-07-04 LAB — CBC WITH DIFFERENTIAL/PLATELET
Basophils Absolute: 0.1 10*3/uL (ref 0.0–0.1)
Basophils Relative: 0.7 % (ref 0.0–3.0)
EOS PCT: 0.8 % (ref 0.0–5.0)
Eosinophils Absolute: 0.1 10*3/uL (ref 0.0–0.7)
HCT: 36 % (ref 36.0–46.0)
Hemoglobin: 11.9 g/dL — ABNORMAL LOW (ref 12.0–15.0)
LYMPHS ABS: 3.2 10*3/uL (ref 0.7–4.0)
Lymphocytes Relative: 40.6 % (ref 12.0–46.0)
MCHC: 33.2 g/dL (ref 30.0–36.0)
MCV: 84.3 fl (ref 78.0–100.0)
MONOS PCT: 13.6 % — AB (ref 3.0–12.0)
Monocytes Absolute: 1.1 10*3/uL — ABNORMAL HIGH (ref 0.1–1.0)
NEUTROS ABS: 3.4 10*3/uL (ref 1.4–7.7)
NEUTROS PCT: 44.3 % (ref 43.0–77.0)
PLATELETS: 319 10*3/uL (ref 150.0–400.0)
RBC: 4.27 Mil/uL (ref 3.87–5.11)
RDW: 13.8 % (ref 11.5–15.5)
WBC: 7.8 10*3/uL (ref 4.0–10.5)

## 2016-07-04 MED ORDER — FLUCONAZOLE 150 MG PO TABS: 150.0000 mg | ORAL_TABLET | Freq: Once | ORAL | 0 refills | Status: AC

## 2016-07-04 NOTE — Telephone Encounter (Signed)
Rx of diflucan sent to her phamacy.

## 2016-07-07 ENCOUNTER — Encounter: Payer: Self-pay | Admitting: Medical

## 2016-07-07 ENCOUNTER — Ambulatory Visit (HOSPITAL_BASED_OUTPATIENT_CLINIC_OR_DEPARTMENT_OTHER): Payer: BLUE CROSS/BLUE SHIELD

## 2016-07-07 ENCOUNTER — Ambulatory Visit (INDEPENDENT_AMBULATORY_CARE_PROVIDER_SITE_OTHER): Payer: BLUE CROSS/BLUE SHIELD | Admitting: Medical

## 2016-07-07 VITALS — BP 136/87 | HR 87 | Wt 204.0 lb

## 2016-07-07 DIAGNOSIS — J029 Acute pharyngitis, unspecified: Secondary | ICD-10-CM | POA: Diagnosis not present

## 2016-07-07 DIAGNOSIS — R591 Generalized enlarged lymph nodes: Secondary | ICD-10-CM

## 2016-07-07 DIAGNOSIS — R221 Localized swelling, mass and lump, neck: Secondary | ICD-10-CM

## 2016-07-07 LAB — POCT RAPID STREP A (OFFICE): Rapid Strep A Screen: POSITIVE — AB

## 2016-07-07 MED ORDER — CLINDAMYCIN HCL 300 MG PO CAPS: 300.0000 mg | ORAL_CAPSULE | Freq: Three times a day (TID) | ORAL | 0 refills | Status: DC

## 2016-07-07 MED ORDER — FLUCONAZOLE 150 MG PO TABS: 150.0000 mg | ORAL_TABLET | Freq: Once | ORAL | 0 refills | Status: AC

## 2016-07-07 NOTE — Progress Notes (Signed)
Subjective:    Patient ID: Miranda [name], female    DOB: Sep 21, 1976, 40 y.o.   MRN: [id number]  HPI   Pt states on Saturday afternoon had st. Modertate pain now.   Some sneezing, some itching eyes and some cough.  Pt left side lymph node is less swollen and painful now.  Pt has felt warm over weekend. Pt has been sweating some. Was yesterday afternoon.  Pt has some pnd. Pt has been using her astelin flonase and allegra.   Review of Systems  Constitutional: Negative for chills, fatigue and fever.  HENT: Positive for congestion, sneezing and sore throat. Negative for ear discharge, ear pain and trouble swallowing.   Eyes: Positive for itching.  Respiratory: Negative for cough, chest tightness, shortness of breath and wheezing.   Cardiovascular: Negative for chest pain and palpitations.  Gastrointestinal: Negative for abdominal pain.  Skin: Negative for rash.  Neurological: Negative for dizziness, weakness, light-headedness and numbness.  Hematological: Positive for adenopathy. Does not bruise/bleed easily.  Psychiatric/Behavioral: Negative for behavioral problems and confusion. The patient is not nervous/anxious.    Past Medical History:  Diagnosis Date  . Acne   . Allergic rhinitis   . Fibroids, intramural 10/22/2011  . HA (headache) 07/2008   MRI (-), MRA 2 anerurysms?------> CT angio (-); saw Neuro, likely migraines, Rx imitrex  . Hemorrhoids   . Hypertension      Social History   Social History  . Marital status: Single    Spouse name: N/A  . Number of children: 0  . Years of education: N/A   Occupational History  . Mother [employer]     Social History Main Topics  . Smoking status: Never Smoker  . Smokeless tobacco: Never Used  . Alcohol use Yes     Comment: socially  . Drug use: No  . Sexual activity: Yes    Birth control/ protection: Pill   Other Topics Concern  . Not on file   Social History Narrative   Lives by herself     Past Surgical  History:  Procedure Laterality Date  . LAPAROTOMY  10/23/2011   Procedure: EXPLORATORY LAPAROTOMY;  Surgeon: Thornell Sartorius, MD;  Location: [facility];  Service: Gynecology;  Laterality: N/A;  thru incision (laparotomy)/Myomyectomy  . WISDOM TOOTH EXTRACTION      Family History  Problem Relation Age of Onset  . Coronary artery disease Other     GM in her 30s  . Diabetes Other     uncle  . Hypertension Mother     M, aunt, GM  . Leukemia Other     cousin  . Breast cancer Other     aunt  . Lung cancer Other     aunt, smoker  . Cancer Maternal Aunt     Lung Cancer  . Cancer Maternal Uncle     Lung Cancer  . Colon cancer Neg Hx     No Known Allergies  Current Outpatient Prescriptions on File Prior to Visit  Medication Sig Dispense Refill  . amLODipine (NORVASC) 10 MG tablet TAKE 1/2 TABLET BY MOUTH EVERY DAY 15 tablet 6  . amoxicillin-clavulanate (AUGMENTIN) 875-125 MG tablet Take 1 tablet by mouth 2 (two) times daily. 20 tablet 0  . azelastine (ASTELIN) 0.1 % nasal spray Place 2 sprays into both nostrils at bedtime as needed for rhinitis. Use in each nostril as directed 30 mL 6  . carvedilol (COREG) 6.25 MG tablet Take 1 tablet (6.25 mg total) by  mouth 2 (two) times daily with a meal. 180 tablet 2  . Fexofenadine HCl (ALLEGRA PO) Take by mouth daily as needed.    . meloxicam (MOBIC) 15 MG tablet Take 1 tablet (15 mg total) by mouth daily. 30 tablet 2  . Norgestim-Eth Estrad Triphasic (ORTHO TRI-CYCLEN, 28, PO) Take 1 tablet by mouth daily.      No current facility-administered medications on file prior to visit.     BP 136/87   Pulse 87   Wt 204 lb (92.5 kg)   LMP 06/30/2016   SpO2 99%   BMI 31.95 kg/m       Objective:   Physical Exam  Mental Status - Alert. General Appearance - Well groomed. Not in acute distress.  Skin Rashes- No Rashes.  HEENT Head- Normal. Ear Auditory Canal - Left- Normal. Right - Normal.Tympanic Membrane- Left- Normal. Right- Normal. Eye  Sclera/Conjunctiva- Left- Normal. Right- Normal. Nose & Sinuses Nasal Mucosa- Left-  Boggy and Congested. Right-  Boggy and  Congested.Bilateral faint pressure maxillary but no  frontal sinus pressure. Mouth & Throat Lips: Upper Lip- Normal: no dryness, cracking, pallor, cyanosis, or vesicular eruption. Lower Lip-Normal: no dryness, cracking, pallor, cyanosis or vesicular eruption. Buccal Mucosa- Bilateral- No Aphthous ulcers. Oropharynx- No Discharge or Erythema. +pnd Tonsils: Characteristics- Bilateral- No Erythema or Congestion. Size/Enlargement- Bilateral- No enlargement. Discharge- bilateral-None. Mouth- no obvious caries seen left lower teeth. Mandilble- no direct tenderness. Parotid gland does not feel swollen.  Neck Neck- Supple. No Masses. Moderate sized left submandibular node about  1.0c m- 1.5 cm size(less swelling now than before) Mild tender now.   Chest and Lung Exam Auscultation: Breath Sounds:-Clear even and unlabored.  Cardiovascular Auscultation:Rythm- Regular, rate and rhythm. Murmurs & Other Heart Sounds:Ausculatation of the heart reveal- No Murmurs.  Lymphatic Head & Neck General Head & Neck Lymphatics: Bilateral: Description- see  Neck exam.  Skin-  No redness or warmth to skin over neck or mandible. No induraction.       Assessment & Plan:  You have responded partially to augmentin. Your rapid strep test came back positive for strep even while on augmentin so I am going to switch you to clindamycin.  For your allergy signs and symptoms continue astelin, flonase and allegra.   I did put in [place] order today. But since your strep test is + will see how you respond to stronger antibiotic before getting study done.  Explained to pt now that strep + source of lymph node swelling appears to be identified now. .  Follow up in 7 days or as needed.    Kobe Jansma, [employer], PA-C

## 2016-07-07 NOTE — Patient Instructions (Addendum)
You have responded partially to augmentin. Your rapid strep test came back positive for strep even while on augmentin so I am going to switch you to clindamycin.  For your allergy signs and symptoms continue astelin, flonase and allegra.  I did put in [place] order today. But since your strep test is + will see how you respond to stronger antibiotic before getting study done.     Follow up in  4- 7 days or as needed.

## 2016-07-10 MED ORDER — FLUCONAZOLE 150 MG PO TABS: 150.0000 mg | ORAL_TABLET | Freq: Once | ORAL | 0 refills | Status: AC

## 2016-07-10 NOTE — Telephone Encounter (Signed)
Diflucan sent again. In event gets yeast infection while on clindamycin.

## 2016-07-15 ENCOUNTER — Encounter: Payer: Self-pay | Admitting: Podiatry

## 2016-07-15 ENCOUNTER — Ambulatory Visit (INDEPENDENT_AMBULATORY_CARE_PROVIDER_SITE_OTHER): Payer: BLUE CROSS/BLUE SHIELD | Admitting: Podiatry

## 2016-07-15 DIAGNOSIS — M79673 Pain in unspecified foot: Secondary | ICD-10-CM

## 2016-07-15 DIAGNOSIS — M722 Plantar fascial fibromatosis: Secondary | ICD-10-CM

## 2016-07-15 NOTE — Patient Instructions (Signed)

## 2016-07-15 NOTE — Progress Notes (Signed)
Patient presents today pick up orthotics. She states her pain is substantially improved her last appointment but she still gets some intermittent pain to the arch of her foot. Orthotics were dispensed today and oral and written break in instructions were discussed. She denies any acute changes since last appointment. Continue stretching, icing exercises well. We'll see her back in 4 weeks or sooner if needed. Call with any questions or concerns meantime.  Celesta Gentile, DPM

## 2016-07-26 ENCOUNTER — Other Ambulatory Visit: Payer: Self-pay | Admitting: Internal Medicine

## 2016-08-11 ENCOUNTER — Telehealth: Payer: Self-pay | Admitting: *Deleted

## 2016-08-11 MED ORDER — MELOXICAM 15 MG PO TABS: 15.0000 mg | ORAL_TABLET | Freq: Every day | ORAL | 0 refills | Status: DC

## 2016-08-11 NOTE — Telephone Encounter (Signed)
Request for 90 day supply of Mobic received. Dr. Berton Lan without refills.

## 2016-09-19 ENCOUNTER — Telehealth: Payer: Self-pay | Admitting: Internal Medicine

## 2016-09-19 NOTE — Telephone Encounter (Signed)
Caller name: Relationship to patient: Self Can be reached:.  [phone]  Pharmacy:.  Reason for call: Patient states she was informed by provider to call if her BP was elevated. States this morning it is 139/88. Plse adv

## 2016-09-19 NOTE — Telephone Encounter (Signed)
Spoke w/ Pt, informed her of recommendations. Pt verbalized understanding.  

## 2016-09-19 NOTE — Telephone Encounter (Signed)
Advise patient to continue same medications, watch salt intake closely, check BP at least 3 times a week. If BPs are consistently more than 145/85 please call.

## 2016-09-19 NOTE — Telephone Encounter (Signed)
FYI

## 2017-01-01 ENCOUNTER — Ambulatory Visit (INDEPENDENT_AMBULATORY_CARE_PROVIDER_SITE_OTHER): Payer: Managed Care, Other (non HMO) | Admitting: Internal Medicine

## 2017-01-01 ENCOUNTER — Encounter: Payer: Self-pay | Admitting: Internal Medicine

## 2017-01-01 VITALS — BP 124/66 | HR 83 | Temp 98.5°F | Resp 14 | Ht 67.0 in | Wt 193.4 lb

## 2017-01-01 DIAGNOSIS — Z0000 Encounter for general adult medical examination without abnormal findings: Secondary | ICD-10-CM | POA: Diagnosis not present

## 2017-01-01 DIAGNOSIS — N926 Irregular menstruation, unspecified: Secondary | ICD-10-CM

## 2017-01-01 DIAGNOSIS — D649 Anemia, unspecified: Secondary | ICD-10-CM

## 2017-01-01 DIAGNOSIS — Z23 Encounter for immunization: Secondary | ICD-10-CM | POA: Diagnosis not present

## 2017-01-01 LAB — FOLLICLE STIMULATING HORMONE: FSH: 6.7 m[IU]/mL

## 2017-01-01 LAB — COMPREHENSIVE METABOLIC PANEL
ALBUMIN: 4 g/dL (ref 3.5–5.2)
ALK PHOS: 71 U/L (ref 39–117)
ALT: 18 U/L (ref 0–35)
AST: 19 U/L (ref 0–37)
BUN: 14 mg/dL (ref 6–23)
CHLORIDE: 105 meq/L (ref 96–112)
CO2: 27 meq/L (ref 19–32)
Calcium: 9.2 mg/dL (ref 8.4–10.5)
Creatinine, Ser: 0.8 mg/dL (ref 0.40–1.20)
GFR: 100.64 mL/min (ref >60.00)
Glucose, Bld: 84 mg/dL (ref 70–99)
POTASSIUM: 4.1 meq/L (ref 3.5–5.1)
SODIUM: 137 meq/L (ref 135–145)
Total Bilirubin: 0.3 mg/dL (ref 0.2–1.2)
Total Protein: 7.5 g/dL (ref 6.0–8.3)

## 2017-01-01 LAB — CBC WITH DIFFERENTIAL/PLATELET
BASOS PCT: 1.1 % (ref 0.0–3.0)
Basophils Absolute: 0 10*3/uL (ref 0.0–0.1)
EOS ABS: 0 10*3/uL (ref 0.0–0.7)
Eosinophils Relative: 0.9 % (ref 0.0–5.0)
HEMATOCRIT: 35.6 % — AB (ref 36.0–46.0)
Hemoglobin: 11.6 g/dL — ABNORMAL LOW (ref 12.0–15.0)
LYMPHS PCT: 38.3 % (ref 12.0–46.0)
Lymphs Abs: 1.7 10*3/uL (ref 0.7–4.0)
MCHC: 32.4 g/dL (ref 30.0–36.0)
MCV: 81.3 fl (ref 78.0–100.0)
Monocytes Absolute: 0.6 10*3/uL (ref 0.1–1.0)
Monocytes Relative: 12.7 % — ABNORMAL HIGH (ref 3.0–12.0)
NEUTROS ABS: 2.1 10*3/uL (ref 1.4–7.7)
Neutrophils Relative %: 47 % (ref 43.0–77.0)
Platelets: 403 10*3/uL — ABNORMAL HIGH (ref 150.0–400.0)
RBC: 4.38 Mil/uL (ref 3.87–5.11)
RDW: 14.1 % (ref 11.5–15.5)
WBC: 4.5 10*3/uL (ref 4.0–10.5)

## 2017-01-01 LAB — LIPID PANEL
CHOLESTEROL: 185 mg/dL (ref 0–200)
HDL: 51.8 mg/dL (ref >39.00)
LDL Cholesterol: 122 mg/dL — ABNORMAL HIGH (ref 0–99)
NonHDL: 133.14
Total CHOL/HDL Ratio: 4
Triglycerides: 56 mg/dL (ref 0.0–149.0)
VLDL: 11.2 mg/dL (ref 0.0–40.0)

## 2017-01-01 LAB — LUTEINIZING HORMONE: LH: 26.89 m[IU]/mL

## 2017-01-01 NOTE — Progress Notes (Signed)
Subjective:    Patient ID: Miranda [name], female    DOB: November 23, 1975, 41 y.o.   MRN: [id number]  DOS:  01/01/2017 Type of visit - description : cpx Interval history: Feeling well,r exercising more and eating healthier, lost several pounds.  Wt Readings from Last 3 Encounters:  01/01/17 193 lb 6 oz (87.7 kg)  07/07/16 204 lb (92.5 kg)  07/03/16 202 lb 6.4 oz (91.8 kg)     Review of Systems  Last year, went to see her gynecologist, periods were heavy, ultrasound show an increased in size fibroids, birth control pills were stopped 08/2016. Since then, periods are less heavy. Patient would like her hormones tests.   Other than above, a 14 point review of systems is negative     Past Medical History:  Diagnosis Date  . Acne   . Allergic rhinitis   . Fibroids, intramural 10/22/2011  . HA (headache) 07/2008   MRI (-), MRA 2 anerurysms?------> CT angio (-); saw Neuro, likely migraines, Rx imitrex  . Hemorrhoids   . Hypertension     Past Surgical History:  Procedure Laterality Date  . LAPAROTOMY  10/23/2011   Procedure: EXPLORATORY LAPAROTOMY;  Surgeon: Thornell Sartorius, MD;  Location: [facility];  Service: Gynecology;  Laterality: N/A;  thru incision (laparotomy)/Myomyectomy  . WISDOM TOOTH EXTRACTION      Social History   Social History  . Marital status: Single    Spouse name: N/A  . Number of children: 0  . Years of education: N/A   Occupational History  . Mother [employer]     Social History Main Topics  . Smoking status: Never Smoker  . Smokeless tobacco: Never Used  . Alcohol use Yes     Comment: socially  . Drug use: No  . Sexual activity: Yes    Birth control/ protection: Pill   Other Topics Concern  . Not on file   Social History Narrative   Lives by herself ; most of family in [place], mother lives near by     Family History  Problem Relation Age of Onset  . Diabetes Other     uncle  . Hypertension Mother     M, aunt, GM  . Leukemia Other     cousin  .  Breast cancer Other     aunt  . Lung cancer Other     aunt, smoker  . Cancer Maternal Aunt     Lung Cancer  . Cancer Maternal Uncle     Lung Cancer  . CAD Maternal Grandmother   . Colon cancer Neg Hx      Allergies as of 01/01/2017   No Known Allergies     Medication List       Accurate as of 01/01/17 11:59 PM. Always use your most recent med list.          ALLEGRA PO Take by mouth daily as needed.   amLODipine 10 MG tablet Commonly known as:  NORVASC Take 0.5 tablets (5 mg total) by mouth daily.   carvedilol 6.25 MG tablet Commonly known as:  COREG Take 1 tablet (6.25 mg total) by mouth 2 (two) times daily with a meal.   clindamycin 1 % lotion Commonly known as:  CLEOCIN T Apply topically 2 (two) times daily.   tretinoin 0.025 % cream Commonly known as:  RETIN-A Apply topically at bedtime.          Objective:   Physical Exam BP 124/66 (BP Location: Left Arm,  Patient Position: Sitting, Cuff Size: Normal)   Pulse 83   Temp 98.5 F (36.9 C) (Oral)   Resp 14   Ht 5\' 7"  (1.702 m)   Wt 193 lb 6 oz (87.7 kg)   LMP 12/22/2016 (Exact Date)   SpO2 96%   BMI 30.29 kg/m   General:   Well developed, well nourished . NAD.  Neck: No  thyromegaly  HEENT:  Normocephalic . Face symmetric, atraumatic Lungs:  CTA B Normal respiratory effort, no intercostal retractions, no accessory muscle use. Heart: RRR,  no murmur.  No pretibial edema bilaterally  Abdomen:  Not distended, soft, non-tender. No rebound or rigidity.   Skin: Exposed areas without rash. Not pale. Not jaundice Neurologic:  alert & oriented X3.  Speech normal, gait appropriate for age and unassisted Strength symmetric and appropriate for age.  Psych: Cognition and judgment appear intact.  Cooperative with normal attention span and concentration.  Behavior appropriate. No anxious or depressed appearing.    Assessment & Plan:   Assessment   HTN Acne Headaches --- MRI (-), MRA 2  anerurysms?------> CT angio (-); saw Neuro, likely migraines, Rx imitrex Syncope-- 01-2014, 02-2016 (likely vaso-vagal)  Fibroid tumors  PLAN: HTN: Controlled with amlodipine and carvedilol.  Uterine fibroids: Saw gyn few months ago,  fibroids  increased in size , they d/c BCP ; pt request to check hormones, LMP 10 days ago Checking a CBC, periods were heavy last year, may have mild anemia. No GI symptoms. Checking vitamin D levels (levels slt  elevated last year).  Acne: per dermatology. RTC one year

## 2017-01-01 NOTE — Assessment & Plan Note (Signed)
--  Tdap 65-18 --female care: sees Dr Melba Coon, last visit 05-2016, had a MMG (no documentation)  --Labs:  CMP, FLP, CBC, vit d, FSH and LH. -- doing great w/ diet exercise , lost ~ 12 pounds

## 2017-01-01 NOTE — Progress Notes (Signed)
Pre visit review using our clinic review tool, if applicable. No additional management support is needed unless otherwise documented below in the visit note. 

## 2017-01-01 NOTE — Patient Instructions (Addendum)
GO TO THE LAB : Get the blood work     GO TO THE FRONT DESK Schedule your next appointment for a  physical exam in one year  

## 2017-01-02 NOTE — Assessment & Plan Note (Signed)
HTN: Controlled with amlodipine and carvedilol.  Uterine fibroids: Saw gyn few months ago,  fibroids  increased in size , they d/c BCP ; pt request to check hormones, LMP 10 days ago Checking a CBC, periods were heavy last year, may have mild anemia. No GI symptoms. Checking vitamin D levels (levels slt  elevated last year).  Acne: per dermatology. RTC one year

## 2017-01-03 LAB — VITAMIN D 1,25 DIHYDROXY
Vitamin D 1, 25 (OH)2 Total: 41 pg/mL (ref 18–72)
Vitamin D2 1, 25 (OH)2: <8 pg/mL
Vitamin D3 1, 25 (OH)2: 41 pg/mL

## 2017-01-19 ENCOUNTER — Other Ambulatory Visit: Payer: Self-pay | Admitting: Internal Medicine

## 2017-04-02 ENCOUNTER — Ambulatory Visit (INDEPENDENT_AMBULATORY_CARE_PROVIDER_SITE_OTHER): Payer: Managed Care, Other (non HMO) | Admitting: Internal Medicine

## 2017-04-02 ENCOUNTER — Encounter: Payer: Self-pay | Admitting: Internal Medicine

## 2017-04-02 VITALS — BP 128/78 | HR 70 | Temp 98.1°F | Resp 14 | Ht 67.0 in | Wt 183.4 lb

## 2017-04-02 DIAGNOSIS — M25471 Effusion, right ankle: Secondary | ICD-10-CM | POA: Diagnosis not present

## 2017-04-02 DIAGNOSIS — B009 Herpesviral infection, unspecified: Secondary | ICD-10-CM | POA: Insufficient documentation

## 2017-04-02 NOTE — Progress Notes (Signed)
Subjective:    Patient ID: Miranda [name], female    DOB: 1976/06/27, 41 y.o.   MRN: [id number]  DOS:  04/02/2017 Type of visit - description : acute Interval history: C/o R foot-ankle edema for the last 4-5 weeks.  this is happening in the context of eating more salt for instance right after Memorial Day, and after she took a trip and had some extra salt in her meals . The swelling does not involve the calves,no calf pain. Symptoms decrease with compression stockings I noted weight loss, she reports is watching her calorie intake and increasing her exercise.  Wt Readings from Last 3 Encounters:  04/02/17 183 lb 6 oz (83.2 kg)  01/01/17 193 lb 6 oz (87.7 kg)  07/07/16 204 lb (92.5 kg)     Review of Systems Denies chest pain, difficulty breathing or palpitations. No rash.   Past Medical History:  Diagnosis Date  . Acne   . Allergic rhinitis   . Fibroids, intramural 10/22/2011  . HA (headache) 07/2008   MRI (-), MRA 2 anerurysms?------> CT angio (-); saw Neuro, likely migraines, Rx imitrex  . Hemorrhoids   . Hypertension     Past Surgical History:  Procedure Laterality Date  . LAPAROTOMY  10/23/2011   Procedure: EXPLORATORY LAPAROTOMY;  Surgeon: Thornell Sartorius, MD;  Location: [facility];  Service: Gynecology;  Laterality: N/A;  thru incision (laparotomy)/Myomyectomy  . WISDOM TOOTH EXTRACTION      Social History   Social History  . Marital status: Single    Spouse name: N/A  . Number of children: 0  . Years of education: N/A   Occupational History  . Mother [employer]     Social History Main Topics  . Smoking status: Never Smoker  . Smokeless tobacco: Never Used  . Alcohol use Yes     Comment: socially  . Drug use: No  . Sexual activity: Yes    Birth control/ protection: Pill   Other Topics Concern  . Not on file   Social History Narrative   Lives by herself ; most of family in [place], mother lives near by      Allergies as of 04/02/2017   No Known Allergies     Medication List       Accurate as of 04/02/17 11:50 AM. Always use your most recent med list.          ALLEGRA PO Take by mouth daily as needed.   amLODipine 10 MG tablet Commonly known as:  NORVASC Take 0.5 tablets (5 mg total) by mouth daily.   carvedilol 6.25 MG tablet Commonly known as:  COREG Take 1 tablet (6.25 mg total) by mouth 2 (two) times daily with a meal.   clindamycin 1 % lotion Commonly known as:  CLEOCIN T Apply topically 2 (two) times daily.   tretinoin 0.025 % cream Commonly known as:  RETIN-A Apply topically at bedtime.          Objective:   Physical Exam BP 128/78 (BP Location: Left Arm, Patient Position: Sitting, Cuff Size: Small)   Pulse 70   Temp 98.1 F (36.7 C) (Oral)   Resp 14   Ht 5\' 7"  (1.702 m)   Wt 183 lb 6 oz (83.2 kg)   LMP 04/01/2017 (Exact Date)   SpO2 97%   BMI 28.72 kg/m  General:   Well developed, well nourished . NAD.  HEENT:  Normocephalic . Face symmetric, atraumatic Lungs:  CTA B Normal respiratory effort, no  intercostal retractions, no accessory muscle use. Heart: RRR,  no murmur.  Lower extremities:  No pretibial edema bilaterally.  Feet and ankles normal to inspection - palpation and symmetric Calves- measured with a tape, symmetric. Soft and no TTP. Pedal pulses normal bilaterally Skin: Not pale. Not jaundice. No varicose veins noted Neurologic:  alert & oriented X3.  Speech normal, gait appropriate for age and unassisted Psych--  Cognition and judgment appear intact.  Cooperative with normal attention span and concentration.  Behavior appropriate. No anxious or depressed appearing.      Assessment & Plan:   Assessment   HTN Acne Headaches --- MRI (-), MRA 2 anerurysms?------> CT angio (-); saw Neuro, likely migraines, Rx imitrex Syncope-- 01-2014, 02-2016 (likely vaso-vagal)  Fibroid tumors, not on BCP  PLAN: Episodic right ankle edema: Probably related to salt intake indiscretions, she is on  amlodipine and that may be playing a role. Doubt DVT at legs are completely symmetric and not tender today. Recommend leg elevation, compression stockings, at some point will consider stop amlodipine. Also recommend to seek medical care if she has pain or swelling at the calf or persistent edema.Marland Kitchen

## 2017-04-02 NOTE — Progress Notes (Signed)
Pre visit review using our clinic review tool, if applicable. No additional management support is needed unless otherwise documented below in the visit note. 

## 2017-04-02 NOTE — Patient Instructions (Signed)
Low salt diet  Compression stockings  Leg elevation  Call if you ever have pain and swelling at either calf

## 2017-04-03 NOTE — Assessment & Plan Note (Signed)
Episodic right ankle edema: Probably related to salt intake indiscretions, she is on amlodipine and that may be playing a role. Doubt DVT at legs are completely symmetric and not tender today. Recommend leg elevation, compression stockings, at some point will consider stop amlodipine. Also recommend to seek medical care if she has pain or swelling at the calf or persistent edema.Marland Kitchen

## 2017-04-05 ENCOUNTER — Encounter: Payer: Self-pay | Admitting: Internal Medicine

## 2017-06-02 LAB — HM MAMMOGRAPHY

## 2017-07-07 ENCOUNTER — Other Ambulatory Visit (INDEPENDENT_AMBULATORY_CARE_PROVIDER_SITE_OTHER): Payer: Managed Care, Other (non HMO)

## 2017-07-07 DIAGNOSIS — D649 Anemia, unspecified: Secondary | ICD-10-CM

## 2017-07-07 LAB — CBC WITH DIFFERENTIAL/PLATELET
BASOS ABS: 0.1 10*3/uL (ref 0.0–0.1)
Basophils Relative: 1 % (ref 0.0–3.0)
Eosinophils Absolute: 0.1 10*3/uL (ref 0.0–0.7)
Eosinophils Relative: 2 % (ref 0.0–5.0)
HCT: 38.5 % (ref 36.0–46.0)
HEMOGLOBIN: 12.4 g/dL (ref 12.0–15.0)
LYMPHS ABS: 2.5 10*3/uL (ref 0.7–4.0)
Lymphocytes Relative: 47.7 % — ABNORMAL HIGH (ref 12.0–46.0)
MCHC: 32.2 g/dL (ref 30.0–36.0)
MCV: 85.2 fl (ref 78.0–100.0)
MONOS PCT: 9 % (ref 3.0–12.0)
Monocytes Absolute: 0.5 10*3/uL (ref 0.1–1.0)
Neutro Abs: 2.1 10*3/uL (ref 1.4–7.7)
Neutrophils Relative %: 40.3 % — ABNORMAL LOW (ref 43.0–77.0)
Platelets: 291 10*3/uL (ref 150.0–400.0)
RBC: 4.52 Mil/uL (ref 3.87–5.11)
RDW: 15.6 % — ABNORMAL HIGH (ref 11.5–15.5)
WBC: 5.2 10*3/uL (ref 4.0–10.5)

## 2017-07-07 LAB — IRON: IRON: 61 ug/dL (ref 42–145)

## 2017-07-07 LAB — FERRITIN: Ferritin: 7 ng/mL — ABNORMAL LOW (ref 10.0–291.0)

## 2017-09-24 ENCOUNTER — Ambulatory Visit: Payer: Self-pay | Admitting: *Deleted

## 2017-09-24 NOTE — Telephone Encounter (Signed)
Just an FYI. Pt headed to ED.  

## 2017-09-24 NOTE — Telephone Encounter (Signed)
Pt states that did tele visit at work yesterday and was given z-pak for sinus infection; she states she was having chills and chest tightness; today the chest tightness has worsened and she is having pain the middle of her chest and fatigue; symptoms initially started felt like allergies; nurse triage initiated and recommendation made for pt for pt to see physician within 4 hours; pt would like to be seen in MD's office but no appointments available; pt the states that she will go to ED per nurse triage recommendation; will notify [facility] of this encounter.  Reason for Disposition . [1] MILD difficulty breathing (e.g., minimal/no SOB at rest, SOB with walking, pulse <100) AND [2] NEW-onset or WORSE than normal  Answer Assessment - Initial Assessment Questions 1. RESPIRATORY STATUS: "Describe your breathing?" (e.g., wheezing, shortness of breath, unable to speak, severe coughing)      Chest tightness 2. ONSET: "When did this breathing problem begin?"     09/23/17 3. PATTERN "Does the difficult breathing come and go, or has it been constant since it started?"      Comes and goes 4. SEVERITY: "How bad is your breathing?" (e.g., mild, moderate, severe)    - MILD: No SOB at rest, mild SOB with walking, speaks normally in sentences, can lay down, no retractions, pulse < 100.    - MODERATE: SOB at rest, SOB with minimal exertion and prefers to sit, cannot lie down flat, speaks in phrases, mild retractions, audible wheezing, pulse 100-120.    - SEVERE: Very SOB at rest, speaks in single words, struggling to breathe, sitting hunched forward, retractions, pulse > 120    Moderate tightness 5. RECURRENT SYMPTOM: "Have you had difficulty breathing before?" If so, ask: "When was the last time?" and "What happened that time?"      Yes, bronchitis 2 years ago 6. CARDIAC HISTORY: "Do you have any history of heart disease?" (e.g., heart attack, angina, bypass surgery, angioplasty)      no 7. LUNG HISTORY:  "Do you have any history of lung disease?"  (e.g., pulmonary embolus, asthma, emphysema)     bronchitis 8. CAUSE: "What do you think is causing the breathing problem?"      unsure 9. OTHER SYMPTOMS: "Do you have any other symptoms? (e.g., dizziness, runny nose, cough, chest pain, fever)     Nasal congestion with clear to yellow mucus; dry cough  10. PREGNANCY: "Is there any chance you are pregnant?" "When was your last menstrual period?"       No LMP November due this week 11. TRAVEL: "Have you traveled out of the country in the last month?" (e.g., travel history, exposures)       no  Protocols used: BREATHING DIFFICULTY-A-AH

## 2017-09-25 NOTE — Telephone Encounter (Signed)
Miranda [name]- Pt never reported to ED after calling w/ chest pain- can you call to check on her please?

## 2017-09-25 NOTE — Telephone Encounter (Signed)
thx

## 2017-09-25 NOTE — Telephone Encounter (Signed)
Follow up call made to patient. [facility] to [facility] [place] [facility] from her home. States they changed ABO and had her continue Mucinex. States she is feeling better today and chest pain has subsided.

## 2017-12-18 ENCOUNTER — Encounter: Payer: Self-pay | Admitting: Internal Medicine

## 2017-12-18 ENCOUNTER — Ambulatory Visit: Payer: Managed Care, Other (non HMO) | Admitting: Internal Medicine

## 2017-12-18 VITALS — BP 132/78 | HR 74 | Temp 98.0°F | Resp 14 | Ht 67.0 in | Wt 175.4 lb

## 2017-12-18 DIAGNOSIS — I10 Essential (primary) hypertension: Secondary | ICD-10-CM | POA: Diagnosis not present

## 2017-12-18 DIAGNOSIS — F419 Anxiety disorder, unspecified: Secondary | ICD-10-CM

## 2017-12-18 MED ORDER — CARVEDILOL 12.5 MG PO TABS: 12.5000 mg | ORAL_TABLET | Freq: Two times a day (BID) | ORAL | 6 refills | Status: DC

## 2017-12-18 NOTE — Progress Notes (Signed)
Subjective:    Patient ID: Miranda [name], female    DOB: 19-Sep-1976, 42 y.o.   MRN: [id number]  DOS:  12/18/2017 Type of visit - description : f/u Interval history: Today, patient is concerned about her blood pressure. She checks frequently, in the last 2 weeks it has been elevated more often than not.  As high as 148/98. She remains very active, eats healthy and has actually lost more weight. I asked about the stress and she reports that she is under a lot of stress for the last 2-3 weeks, at work. She become tearful, she feels that she has been unable to deal with anxiety for the last couple of weeks.   Wt Readings from Last 3 Encounters:  12/18/17 175 lb 6 oz (79.5 kg)  04/02/17 183 lb 6 oz (83.2 kg)  01/01/17 193 lb 6 oz (87.7 kg)      Review of Systems Denies depression, sleep is somewhat decreased.  Denies suicidal ideas. No chest pain, difficulty breathing no palpitations. She had mild headache on and off, not the worst of her life. Not on birth control, last menstrual ~4 days ago.   Past Medical History:  Diagnosis Date  . Acne   . Allergic rhinitis   . Fibroids, intramural 10/22/2011  . HA (headache) 07/2008   MRI (-), MRA 2 anerurysms?------> CT angio (-); saw Neuro, likely migraines, Rx imitrex  . Hemorrhoids   . Hypertension     Past Surgical History:  Procedure Laterality Date  . LAPAROTOMY  10/23/2011   Procedure: EXPLORATORY LAPAROTOMY;  Surgeon: Thornell Sartorius, MD;  Location: [facility];  Service: Gynecology;  Laterality: N/A;  thru incision (laparotomy)/Myomyectomy  . WISDOM TOOTH EXTRACTION      Social History   Socioeconomic History  . Marital status: Single    Spouse name: Not on file  . Number of children: 0  . Years of education: Not on file  . Highest education level: Not on file  Occupational History  . 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 Financial resource strain: Not on file  . Food insecurity:    Worry: Not on file   Inability: Not on file  . Transportation needs:    Medical: Not on file    Non-medical: Not on file  Tobacco Use  . Smoking status: Never Smoker  . Smokeless tobacco: Never Used  Substance and Sexual Activity  . Alcohol use: Yes    Comment: socially  . Drug use: No  . Sexual activity: Yes    Birth control/protection: Pill  Lifestyle  . Physical activity:    Days per week: Not on file    Minutes per session: Not on file  . Stress: Not on file  Relationships  . Social connections:    Talks on phone: Not on file    Gets together: Not on file    Attends religious service: Not on file    Active member of club or organization: Not on file    Attends meetings of clubs or organizations: Not on file    Relationship status: Not on file  . Intimate partner violence:    Fear of current or ex partner: Not on file    Emotionally abused: Not on file    Physically abused: Not on file    Forced sexual activity: Not on file  Other Topics Concern  . Not on file  Social History Narrative   Lives by herself ; most of family in  [place], mother lives near by      Allergies as of 12/18/2017   No Known Allergies     Medication List        Accurate as of 12/18/17 11:59 PM. Always use your most recent med list.          ALLEGRA PO Take by mouth daily as needed.   amLODipine 10 MG tablet Commonly known as:  NORVASC Take 0.5 tablets (5 mg total) by mouth daily.   carvedilol 12.5 MG tablet Commonly known as:  COREG Take 1 tablet (12.5 mg total) by mouth 2 (two) times daily with a meal.   clindamycin 1 % lotion Commonly known as:  CLEOCIN T Apply topically 2 (two) times daily.   tretinoin 0.025 % cream Commonly known as:  RETIN-A Apply topically at bedtime.          Objective:   Physical Exam BP 132/78 (BP Location: Left Arm, Patient Position: Sitting, Cuff Size: Small)   Pulse 74   Temp 98 F (36.7 C) (Oral)   Resp 14   Ht 5\' 7"  (1.702 m)   Wt 175 lb 6 oz (79.5 kg)    SpO2 98%   BMI 27.47 kg/m  General:   Well developed, well nourished . HEENT:  Normocephalic . Face symmetric, atraumatic Lungs:  CTA B Normal respiratory effort, no intercostal retractions, no accessory muscle use. Heart: RRR,  no murmur.  No pretibial edema bilaterally  Skin: Not pale. Not jaundice Neurologic:  alert & oriented X3.  Speech normal, gait appropriate for age and unassisted Psych--  Cognition and judgment appear intact.  Cooperative with normal attention span and concentration.  Anxious, tearful.      Assessment & Plan:  Assessment   HTN Acne Headaches --- MRI (-), MRA 2 anerurysms?------> CT angio (-); saw Neuro, likely migraines, Rx imitrex Syncope-- 01-2014, 02-2016 (likely vaso-vagal)  Fibroid tumors, not on BCP  PLAN: HTN: Slightly elevated lately, patient very concerned, likely related to stress, see next.  Will continue amlodipine, increase carvedilol to 12.5 mg twice daily.  Check ambulatory BPs.  See AVS Anxiety: As described above, she is apparently under a lot of stress at work, we talk about things she could like increase physical activity, meditation, etc.  Also a counselor may be a good idea, information provided.  We talk about medications to, she is not ready for that at this point but knows to call me if she is not improving. States that increase stress at work is temporary. See AVS.   Today, I spent more than 25   min with the patient: >50% of the time counseling regards stress management

## 2017-12-18 NOTE — Patient Instructions (Signed)
Increase carvedilol to 12.5 mg twice a day  Other medications the same  Check the  blood pressure daily after resting for 15 minutes Be sure your blood pressure is between 110/65 and  135/85.  if it is consistently higher or lower, let me know    Consider see a counselor  Okay to take Tylenol PM if you have difficulty sleeping, watch for excessive sedation the next day  Follow-up in 2-3 weeks if you are not improving

## 2017-12-18 NOTE — Progress Notes (Signed)
Pre visit review using our clinic review tool, if applicable. No additional management support is needed unless otherwise documented below in the visit note. 

## 2017-12-19 NOTE — Assessment & Plan Note (Signed)
HTN: Slightly elevated lately, patient very concerned, likely related to stress, see next.  Will continue amlodipine, increase carvedilol to 12.5 mg twice daily.  Check ambulatory BPs.  See AVS Anxiety: As described above, she is apparently under a lot of stress at work, we talk about things she could like increase physical activity, meditation, etc.  Also a counselor may be a good idea, information provided.  We talk about medications to, she is not ready for that at this point but knows to call me if she is not improving. States that increase stress at work is temporary. See AVS.

## 2018-01-10 ENCOUNTER — Other Ambulatory Visit: Payer: Self-pay | Admitting: Internal Medicine

## 2018-05-11 LAB — HM PAP SMEAR: HM Pap smear: NEGATIVE

## 2018-05-12 ENCOUNTER — Encounter: Payer: Self-pay | Admitting: Internal Medicine

## 2018-05-18 ENCOUNTER — Encounter: Payer: Self-pay | Admitting: Internal Medicine

## 2018-06-01 ENCOUNTER — Encounter: Payer: Self-pay | Admitting: Internal Medicine

## 2018-07-09 LAB — HM MAMMOGRAPHY

## 2018-07-12 ENCOUNTER — Other Ambulatory Visit: Payer: Self-pay | Admitting: Internal Medicine

## 2018-07-12 MED ORDER — CARVEDILOL 6.25 MG PO TABS: 6.2500 mg | ORAL_TABLET | Freq: Two times a day (BID) | ORAL | 3 refills | Status: DC

## 2018-07-13 ENCOUNTER — Other Ambulatory Visit: Payer: Self-pay | Admitting: Internal Medicine

## 2018-07-15 ENCOUNTER — Encounter: Payer: Self-pay | Admitting: Internal Medicine

## 2018-07-15 ENCOUNTER — Other Ambulatory Visit: Payer: Self-pay | Admitting: Internal Medicine

## 2018-07-16 ENCOUNTER — Other Ambulatory Visit: Payer: Self-pay | Admitting: Internal Medicine

## 2018-08-10 ENCOUNTER — Ambulatory Visit (INDEPENDENT_AMBULATORY_CARE_PROVIDER_SITE_OTHER): Payer: Managed Care, Other (non HMO) | Admitting: Internal Medicine

## 2018-08-10 ENCOUNTER — Encounter: Payer: Self-pay | Admitting: Internal Medicine

## 2018-08-10 VITALS — BP 126/60 | HR 74 | Temp 98.4°F | Resp 16 | Ht 67.0 in | Wt 178.1 lb

## 2018-08-10 DIAGNOSIS — Z0000 Encounter for general adult medical examination without abnormal findings: Secondary | ICD-10-CM | POA: Diagnosis not present

## 2018-08-10 DIAGNOSIS — T50905A Adverse effect of unspecified drugs, medicaments and biological substances, initial encounter: Secondary | ICD-10-CM | POA: Diagnosis not present

## 2018-08-10 NOTE — Progress Notes (Signed)
Pre visit review using our clinic review tool, if applicable. No additional management support is needed unless otherwise documented below in the visit note. 

## 2018-08-10 NOTE — Assessment & Plan Note (Addendum)
--  Tdap 4-18.  Had a localized allergic reaction to the flu shot in 2016, deltoid swelling/pain/itching and redness for 3 to 4 days.  I am somewhat reluctant to proceed with a flu shot, recommend allergist eval. Will arrange  --female care: sees Dr Melba Coon --Labs:  CMP, FLP, CBC, TSH --Diet and exercise : discussed  , doing well, practicing yoga

## 2018-08-10 NOTE — Patient Instructions (Signed)
GO TO THE LAB : Get the blood work     GO TO THE FRONT DESK Schedule your next appointment for a  Physical exam in 1 year     Check the  blood pressure 2 or 3 times a month   Be sure your blood pressure is between 110/65 and  135/85. If it is consistently higher or lower, let me know

## 2018-08-10 NOTE — Progress Notes (Signed)
Subjective:    Patient ID: Miranda [name], female    DOB: 21-Dec-1975, 42 y.o.   MRN: [id number]  DOS:  08/10/2018 Type of visit - description : cpx Interval history: No major concerns  Review of Systems Since the last visit, stress at work has decreased and she is doing well.  Other than above, a 14 point review of systems is negative      Past Medical History:  Diagnosis Date  . Acne   . Allergic rhinitis   . Fibroids, intramural 10/22/2011  . HA (headache) 07/2008   MRI (-), MRA 2 anerurysms?------> CT angio (-); saw Neuro, likely migraines, Rx imitrex  . Hemorrhoids   . Hypertension     Past Surgical History:  Procedure Laterality Date  . LAPAROTOMY  10/23/2011   Procedure: EXPLORATORY LAPAROTOMY;  Surgeon: Thornell Sartorius, MD;  Location: [facility];  Service: Gynecology;  Laterality: N/A;  thru incision (laparotomy)/Myomyectomy  . WISDOM TOOTH EXTRACTION      Social History   Socioeconomic History  . Marital status: Single    Spouse name: Not on file  . Number of children: 0  . Years of education: Not on file  . Highest education level: Not on file  Occupational History  . 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 Financial resource strain: Not on file  . Food insecurity:    Worry: Not on file    Inability: Not on file  . Transportation needs:    Medical: Not on file    Non-medical: Not on file  Tobacco Use  . Smoking status: Never Smoker  . Smokeless tobacco: Never Used  Substance and Sexual Activity  . Alcohol use: Yes    Comment: socially  . Drug use: No  . Sexual activity: Yes    Birth control/protection: Pill  Lifestyle  . Physical activity:    Days per week: Not on file    Minutes per session: Not on file  . Stress: Not on file  Relationships  . Social connections:    Talks on phone: Not on file    Gets together: Not on file    Attends religious service: Not on file    Active member of club or organization: Not on file    Attends  meetings of clubs or organizations: Not on file    Relationship status: Not on file  . Intimate partner violence:    Fear of current or ex partner: Not on file    Emotionally abused: Not on file    Physically abused: Not on file    Forced sexual activity: Not on file  Other Topics Concern  . Not on file  Social History Narrative   Lives by herself ; most of family in [place], mother lives near by     Family History  Problem Relation Age of Onset  . Diabetes Other        uncle  . Hypertension Mother        M, aunt, GM  . Leukemia Other        cousin  . Breast cancer Other        aunt  . Cancer Maternal Aunt        Lung Cancer  . Cancer Maternal Uncle        Lung Cancer  . CAD Maternal Grandmother   . Colon cancer Neg Hx      Allergies as of 08/10/2018   No Known Allergies  Medication List        Accurate as of 08/10/18 11:59 PM. Always use your most recent med list.          ALLEGRA PO Take by mouth daily as needed.   amLODipine 10 MG tablet Commonly known as:  NORVASC Take 0.5 tablets (5 mg total) by mouth daily.   carvedilol 6.25 MG tablet Commonly known as:  COREG Take 1 tablet (6.25 mg total) by mouth 2 (two) times daily with a meal.   clindamycin 1 % lotion Commonly known as:  CLEOCIN T Apply topically 2 (two) times daily.   FIRST-TESTOSTERONE MC 2 % Crea Place onto the skin as directed.   thyroid 65 MG tablet Commonly known as:  ARMOUR Take 65 mg by mouth daily.   tretinoin 0.025 % cream Commonly known as:  RETIN-A Apply topically at bedtime.          Objective:   Physical Exam BP 126/60 (BP Location: Left Arm, Patient Position: Sitting, Cuff Size: Small)   Pulse 74   Temp 98.4 F (36.9 C) (Oral)   Resp 16   Ht 5\' 7"  (1.702 m)   Wt 178 lb 2 oz (80.8 kg)   SpO2 97%   BMI 27.90 kg/m  General: Well developed, NAD, BMI noted Neck: No  thyromegaly  HEENT:  Normocephalic . Face symmetric, atraumatic Lungs:  CTA B Normal  respiratory effort, no intercostal retractions, no accessory muscle use. Heart: RRR,  no murmur.  No pretibial edema bilaterally  Abdomen:  Not distended, soft, non-tender. No rebound or rigidity.   Skin: Exposed areas without rash. Not pale. Not jaundice Neurologic:  alert & oriented X3.  Speech normal, gait appropriate for age and unassisted Strength symmetric and appropriate for age.  Psych: Cognition and judgment appear intact.  Cooperative with normal attention span and concentration.  Behavior appropriate. No anxious or depressed appearing.     Assessment & Plan:   Assessment   HTN Acne Headaches --- MRI (-), MRA 2 anerurysms?------> CT angio (-); saw Neuro, likely migraines, Rx imitrex Syncope-- 01-2014, 02-2016 (likely vaso-vagal)  Fibroid tumors, not on BCP Integrative therapies: they Rx armour thyroid and testosterone   PLAN: HTN: On amlodipine, carvedilol.  Recommend ambulatory BPs. Stress: See last visit,  stress at work decreased, feeling better Armour Thyroid/testosterone: Prescribed elsewhere by another MD x last 4 months, she is aware that such prescriptions are no my routine practice and encouraged her to ask about s/e & risk of those meds to the other MD. RTC 1 year

## 2018-08-11 LAB — COMPREHENSIVE METABOLIC PANEL
ALBUMIN: 4.2 g/dL (ref 3.5–5.2)
ALK PHOS: 60 U/L (ref 39–117)
ALT: 9 U/L (ref 0–35)
AST: 13 U/L (ref 0–37)
BUN: 15 mg/dL (ref 6–23)
CO2: 26 mEq/L (ref 19–32)
Calcium: 9.4 mg/dL (ref 8.4–10.5)
Chloride: 104 mEq/L (ref 96–112)
Creatinine, Ser: 0.77 mg/dL (ref 0.40–1.20)
GFR: 104.4 mL/min (ref >60.00)
Glucose, Bld: 76 mg/dL (ref 70–99)
POTASSIUM: 3.8 meq/L (ref 3.5–5.1)
Sodium: 138 mEq/L (ref 135–145)
TOTAL PROTEIN: 7.1 g/dL (ref 6.0–8.3)
Total Bilirubin: 0.3 mg/dL (ref 0.2–1.2)

## 2018-08-11 LAB — LIPID PANEL
CHOL/HDL RATIO: 3
Cholesterol: 198 mg/dL (ref 0–200)
HDL: 63.8 mg/dL (ref >39.00)
LDL CALC: 125 mg/dL — AB (ref 0–99)
NONHDL: 134.25
Triglycerides: 45 mg/dL (ref 0.0–149.0)
VLDL: 9 mg/dL (ref 0.0–40.0)

## 2018-08-11 LAB — CBC WITH DIFFERENTIAL/PLATELET
BASOS PCT: 1.3 % (ref 0.0–3.0)
Basophils Absolute: 0.1 10*3/uL (ref 0.0–0.1)
Eosinophils Absolute: 0.1 10*3/uL (ref 0.0–0.7)
Eosinophils Relative: 1.4 % (ref 0.0–5.0)
HEMATOCRIT: 37.8 % (ref 36.0–46.0)
Hemoglobin: 12.5 g/dL (ref 12.0–15.0)
LYMPHS PCT: 44.9 % (ref 12.0–46.0)
Lymphs Abs: 2.2 10*3/uL (ref 0.7–4.0)
MCHC: 33.1 g/dL (ref 30.0–36.0)
MCV: 87.5 fl (ref 78.0–100.0)
MONOS PCT: 8.2 % (ref 3.0–12.0)
Monocytes Absolute: 0.4 10*3/uL (ref 0.1–1.0)
NEUTROS ABS: 2.2 10*3/uL (ref 1.4–7.7)
Neutrophils Relative %: 44.2 % (ref 43.0–77.0)
Platelets: 310 10*3/uL (ref 150.0–400.0)
RBC: 4.32 Mil/uL (ref 3.87–5.11)
RDW: 14 % (ref 11.5–15.5)
WBC: 4.9 10*3/uL (ref 4.0–10.5)

## 2018-08-11 LAB — TSH: TSH: 1.25 u[IU]/mL (ref 0.35–4.50)

## 2018-08-11 NOTE — Assessment & Plan Note (Signed)
HTN: On amlodipine, carvedilol.  Recommend ambulatory BPs. Stress: See last visit,  stress at work decreased, feeling better Armour Thyroid/testosterone: Prescribed elsewhere by another MD x last 4 months, she is aware that such prescriptions are no my routine practice and encouraged her to ask about s/e & risk of those meds to the other MD. RTC 1 year

## 2018-09-14 ENCOUNTER — Other Ambulatory Visit: Payer: Self-pay | Admitting: Internal Medicine

## 2018-09-15 ENCOUNTER — Other Ambulatory Visit: Payer: Self-pay | Admitting: Internal Medicine

## 2018-09-15 MED ORDER — CARVEDILOL 6.25 MG PO TABS: 6.2500 mg | ORAL_TABLET | Freq: Two times a day (BID) | ORAL | 3 refills | Status: DC

## 2018-10-03 ENCOUNTER — Other Ambulatory Visit: Payer: Self-pay | Admitting: Internal Medicine

## 2018-11-15 ENCOUNTER — Encounter: Payer: Self-pay | Admitting: Internal Medicine

## 2018-11-15 ENCOUNTER — Ambulatory Visit: Payer: Managed Care, Other (non HMO) | Admitting: Internal Medicine

## 2018-11-15 VITALS — BP 116/72 | HR 72 | Temp 98.2°F | Resp 16 | Ht 67.0 in | Wt 175.1 lb

## 2018-11-15 DIAGNOSIS — J069 Acute upper respiratory infection, unspecified: Secondary | ICD-10-CM | POA: Diagnosis not present

## 2018-11-15 NOTE — Progress Notes (Signed)
Pre visit review using our clinic review tool, if applicable. No additional management support is needed unless otherwise documented below in the visit note. 

## 2018-11-15 NOTE — Patient Instructions (Signed)
Rest, fluids , tylenol  For cough:  Take Mucinex DM   as needed until better Or Coricidin  For nasal congestion: Use OTC Nasocort or Flonase : 2 nasal sprays on each side of the nose in the morning until you feel better   Call if not gradually better over the next  5  days  Call anytime if the symptoms are severe

## 2018-11-15 NOTE — Progress Notes (Signed)
Subjective:    Patient ID: Miranda [name], female    DOB: [birth year], 43 y.o.   MRN: [id number]  DOS:  11/15/2018 Type of visit - description: acute Sx started ~ 6 days ago: Sinus and chest congestion, had a headache yesterday, better today, just mild and nagging. Has been taking OTC Coricidin.  Review of Systems Denies fevers. Had a mild sore throat, some ear congestion.  Very little nasal discharge. No nausea, vomiting, diarrhea.  No unusual aches and pains Mild cough, dry.  Past Medical History:  Diagnosis Date  . Acne   . Allergic rhinitis   . Fibroids, intramural 10/22/2011  . HA (headache) 07/2008   MRI (-), MRA 2 anerurysms?------> CT angio (-); saw Neuro, likely migraines, Rx imitrex  . Hemorrhoids   . Hypertension     Past Surgical History:  Procedure Laterality Date  . LAPAROTOMY  10/23/2011   Procedure: EXPLORATORY LAPAROTOMY;  Surgeon: Thornell Sartorius, MD;  Location: [facility];  Service: Gynecology;  Laterality: N/A;  thru incision (laparotomy)/Myomyectomy  . WISDOM TOOTH EXTRACTION      Social History   Socioeconomic History  . Marital status: Single    Spouse name: Not on file  . Number of children: 0  . Years of education: Not on file  . Highest education level: Not on file  Occupational History  . 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 Financial resource strain: Not on file  . Food insecurity:    Worry: Not on file    Inability: Not on file  . Transportation needs:    Medical: Not on file    Non-medical: Not on file  Tobacco Use  . Smoking status: Never Smoker  . Smokeless tobacco: Never Used  Substance and Sexual Activity  . Alcohol use: Yes    Comment: socially  . Drug use: No  . Sexual activity: Yes    Birth control/protection: Pill  Lifestyle  . Physical activity:    Days per week: Not on file    Minutes per session: Not on file  . Stress: Not on file  Relationships  . Social connections:    Talks on phone: Not on file    Gets  together: Not on file    Attends religious service: Not on file    Active member of club or organization: Not on file    Attends meetings of clubs or organizations: Not on file    Relationship status: Not on file  . Intimate partner violence:    Fear of current or ex partner: Not on file    Emotionally abused: Not on file    Physically abused: Not on file    Forced sexual activity: Not on file  Other Topics Concern  . Not on file  Social History Narrative   Lives by herself ; most of family in [place], mother lives near by      Allergies as of 11/15/2018   No Known Allergies     Medication List       Accurate as of November 15, 2018 11:59 PM. Always use your most recent med list.        ALLEGRA PO Take by mouth daily as needed.   amLODipine 10 MG tablet Commonly known as:  NORVASC Take 0.5 tablets (5 mg total) by mouth daily.   carvedilol 6.25 MG tablet Commonly known as:  COREG Take 1 tablet (6.25 mg total) by mouth 2 (two) times daily with a meal.  clindamycin 1 % lotion Commonly known as:  CLEOCIN T Apply topically 2 (two) times daily.   FIRST-TESTOSTERONE MC 2 % Crea Place onto the skin as directed.   thyroid 65 MG tablet Commonly known as:  ARMOUR Take 65 mg by mouth daily.   tretinoin 0.025 % cream Commonly known as:  RETIN-A Apply topically at bedtime.           Objective:   Physical Exam BP 116/72 (BP Location: Left Arm, Patient Position: Sitting, Cuff Size: Small)   Pulse 72   Temp 98.2 F (36.8 C) (Oral)   Resp 16   Ht 5\' 7"  (1.702 m)   Wt 175 lb 2 oz (79.4 kg)   LMP 11/13/2018 (Exact Date)   SpO2 98%   BMI 27.43 kg/m  General:   Well developed, NAD, BMI noted. HEENT:  Normocephalic . Face symmetric, atraumatic.  TMs normal, throat symmetric, nose not congested Lungs:  CTA B Normal respiratory effort, no intercostal retractions, no accessory muscle use. Heart: RRR,  no murmur.  No pretibial edema bilaterally  Skin: Not pale. Not  jaundice Neurologic:  alert & oriented X3.  Speech normal, gait appropriate for age and unassisted Psych--  Cognition and judgment appear intact.  Cooperative with normal attention span and concentration.  Behavior appropriate. No anxious or depressed appearing.      Assessment    Assessment   HTN Acne Headaches --- MRI (-), MRA 2 anerurysms?------> CT angio (-); saw Neuro, likely migraines, Rx imitrex Syncope-- 01-2014, 02-2016 (likely vaso-vagal)  Fibroid tumors, not on BCP Integrative therapies: they Rx armour thyroid and testosterone   PLAN: URI: Recommend supportive treatment including Mucinex or Coricidin, fluids, Flonase.  Call if not gradually better.  At this point, no evidence of any serious infection.

## 2018-11-16 NOTE — Assessment & Plan Note (Signed)
URI: Recommend supportive treatment including Mucinex or Coricidin, fluids, Flonase.  Call if not gradually better.  At this point, no evidence of any serious infection.

## 2019-01-17 ENCOUNTER — Other Ambulatory Visit: Payer: Self-pay | Admitting: Internal Medicine

## 2019-06-23 ENCOUNTER — Telehealth: Payer: Self-pay

## 2019-06-23 DIAGNOSIS — T7840XD Allergy, unspecified, subsequent encounter: Secondary | ICD-10-CM

## 2019-06-23 NOTE — Telephone Encounter (Signed)
Please advise 

## 2019-06-23 NOTE — Telephone Encounter (Signed)
Copied from [place] [id number]. Topic: General - Other >> Jun 23, 2019  2:32 PM Wynetta Emery, [place] C wrote: Reason for CRM: pt called in to be advised. Pt says that she just turned 14 and would like to inquire about having a colonoscopy? ALSO, pt says that she had a allergic reaction to the flu shot last year and was suppose to see a allergist, pt would like to know if she should have the flu shot this year?     CB: [phone]

## 2019-06-23 NOTE — Telephone Encounter (Signed)
Spoke w/ Pt-informed of allergist referral and recommendations to discuss colon cancer screening at cpx visit in November. Pt verbalized understanding.

## 2019-06-23 NOTE — Telephone Encounter (Signed)
Arrange an allergist referral, rule out flu shot allergy. We can discuss colon cancer screening at her next physical exam unless she has symptoms.

## 2019-07-18 ENCOUNTER — Encounter: Payer: Self-pay | Admitting: Allergy

## 2019-07-18 ENCOUNTER — Other Ambulatory Visit: Payer: Self-pay

## 2019-07-18 ENCOUNTER — Ambulatory Visit (INDEPENDENT_AMBULATORY_CARE_PROVIDER_SITE_OTHER): Payer: Managed Care, Other (non HMO) | Admitting: Allergy

## 2019-07-18 VITALS — BP 118/76 | HR 67 | Temp 98.2°F | Resp 16 | Ht 66.5 in | Wt 189.0 lb

## 2019-07-18 DIAGNOSIS — Z23 Encounter for immunization: Secondary | ICD-10-CM | POA: Diagnosis not present

## 2019-07-18 DIAGNOSIS — J3089 Other allergic rhinitis: Secondary | ICD-10-CM | POA: Diagnosis not present

## 2019-07-18 DIAGNOSIS — T50B95D Adverse effect of other viral vaccines, subsequent encounter: Secondary | ICD-10-CM

## 2019-07-18 LAB — CBC AND DIFFERENTIAL: Hemoglobin: 13.5 (ref 12.0–16.0)

## 2019-07-18 NOTE — Progress Notes (Signed)
New Patient Note  RE: Miranda [name] MRN: [id number] DOB: 1976-06-15 Date of Office Visit: 07/18/2019  Referring provider: Colon Branch, MD Primary care provider: Colon Branch, MD  Chief Complaint: Allergic Reaction (had reaction a couple of years ago and now her PCP does not want to give her one for fear of another reaction. unsure of which one is was. last one documented in chart was 2016 ?Fluarix?)  History of Present Illness: I had the pleasure of seeing Miranda [name] for initial evaluation at the [facility] on 07/18/2019. She is a 43 y.o. female, who is referred here by Colon Branch, MD for the evaluation of flu vaccine reaction   Flu vaccine reaction: Patient had large localized reactions with erythema after the flu vaccine. It persisted for about 3 days.  Denies any other associated symptoms.  Patient had the flu shot before without any issues.  She also had other vaccines with no issues. No history of latex allergy.   Patient tolerates eggs and gelatin with no issues.   Patient received flu vaccine in 2016 and 2009 according to the chart. She can't recall if she received any additional ones in between.   Assessment and Plan: Miranda [name] is a 43 y.o. female with: Influenza vaccine side effect Patient had large localized reaction with erythema after flu vaccine administration in 2016 which persisted for 3 days. No other associated symptoms. Tolerates eggs and gelatin with no issues.   Administered IM flu vaccine 0.32mL with 15 minutes wait. Patient had no reaction and administered IM flu vaccine 0.72mL with 30 minutes wait.   She experienced mild perioral pruritus which resolved with no medications right after the injection and then noted some mild left upper arm pruritus at the site of the injection. Symptoms of the pruritus was improving with no medications. She was given benadryl 50mg  and symptoms significantly improved after 20 minutes. Vitals were stable  throughout. No visible rashes, lungs clear on exam, oropharynx unremarkable.   Patient received full dose of flu vaccine today.   Pruritus can be a sign of IgE mediated reaction however the symptoms were very mild and improved with no medications.   Advised patient to monitor symptoms. For mild symptoms you can take over the counter antihistamines such as Benadryl 25mg  to 50mg  every 4-6 hours as needed and monitor symptoms closely. If symptoms worsen or if you have severe symptoms including breathing issues, throat closure, significant swelling, whole body hives, severe diarrhea and vomiting, lightheadedness then seek immediate medical care.  Recommend that she gets next year's flu vaccine at either our office with 30 minutes wait or the PCP office with 30 minutes wait.   The large localized erythema without pruritus in the past is not as concerning as that is most likely due to the fact she had a strong immune response to the vaccine.   Other allergic rhinitis Rhino conjunctivitis symptoms mainly in the spring and summer for many years. Uses Allegra and Flonase with good benefit. No formal allergy evaluation/testing.   May use over the counter antihistamines such as Zyrtec (cetirizine), Claritin (loratadine), Allegra (fexofenadine), or Xyzal (levocetirizine) daily as needed.  May use Flonase 1-2 sprays per nostril daily as needed for nasal congestion.   If symptoms worsen consider allergy testing to environmental allergies in the future.    Return in about 1 year (around 07/17/2020).  Other allergy screening: Asthma: no Rhino conjunctivitis: yes  She reports symptoms of itch eyes,  rhinorrhea, coughing. Symptoms have been going on for many years. The symptoms are present during the spring and summer. She has used [employer] account planner with fair improvement in symptoms. Previous work up includes: none. Food allergy: no Medication allergy: no Hymenoptera allergy: no Urticaria: no  Eczema:no History of recurrent infections suggestive of immunodeficency: no  Diagnostics:  Oral Challenge - 07/18/19 1500    Challenge Food/Drug  Flu Vaccine    Lot #  if Applicable  [id number]    Food/Drug provided by  Office    BP  124/70    Pulse  80    Respirations  16    Lungs  Clear    Skin  Clear    Mouth  Clear    Time  1450    Dose  .45mL     Lungs  Clear    Skin  Clear    Mouth  Clear    Time  1511    Dose  .60mL    BP  118/76    Pulse  67    Respirations  16    Lungs  Clear    Skin  Clear    Mouth  Clear      Past Medical History: Patient Active Problem List   Diagnosis Date Noted  . Influenza vaccine side effect 07/18/2019  . Need for immunization against influenza 07/18/2019  . 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 Plantar fasciitis 06/17/2016  . PCP NOTES >>> 06/29/2015  . Syncope 02/14/2014  . Fibroids, intramural 10/22/2011  . Annual physical exam 01/23/2011  . FIBROIDS, UTERUS 01/21/2010  . Hypertension 01/05/2008  . INTERNAL HEMORRHOIDS 12/29/2007  . Other allergic rhinitis 03/19/2007   Past Medical History:  Diagnosis Date  . Acne   . Allergic rhinitis   . Fibroids, intramural 10/22/2011  . HA (headache) 07/2008   MRI (-), MRA 2 anerurysms?------> CT angio (-); saw Neuro, likely migraines, Rx imitrex  . Hemorrhoids   . Hypertension    Past Surgical History: Past Surgical History:  Procedure Laterality Date  . LAPAROTOMY  10/23/2011   Procedure: EXPLORATORY LAPAROTOMY;  Surgeon: Thornell Sartorius, MD;  Location: [facility];  Service: Gynecology;  Laterality: N/A;  thru incision (laparotomy)/Myomyectomy  . WISDOM TOOTH EXTRACTION     Medication List:  Current Outpatient Medications  Medication Sig Dispense Refill  . amLODipine (NORVASC) 10 MG tablet Take 0.5 tablets (5 mg total) by mouth daily. 45 tablet 3  . carvedilol (COREG) 6.25 MG tablet Take 1 tablet (6.25 mg total) by mouth 2 (two) times daily with a meal. 180 tablet 3  . clindamycin  (CLEOCIN T) 1 % lotion Apply topically 2 (two) times daily.    Marland Kitchen Fexofenadine HCl (ALLEGRA PO) Take by mouth daily as needed.     No current facility-administered medications for this visit.    Allergies: No Known Allergies Social History: Social History   Socioeconomic History  . Marital status: Single    Spouse name: Not on file  . Number of children: 0  . Years of education: Not on file  . Highest education level: Not on file  Occupational History  . 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 Financial resource strain: Not on file  . Food insecurity    Worry: Not on file    Inability: Not on file  . Transportation needs    Medical: Not on file    Non-medical: Not on file  Tobacco Use  . Smoking status: Never  Smoker  . Smokeless tobacco: Never Used  Substance and Sexual Activity  . Alcohol use: Yes    Comment: socially  . Drug use: No  . Sexual activity: Yes    Birth control/protection: Pill  Lifestyle  . Physical activity    Days per week: Not on file    Minutes per session: Not on file  . Stress: Not on file  Relationships  . Social Herbalist on phone: Not on file    Gets together: Not on file    Attends religious service: Not on file    Active member of club or organization: Not on file    Attends meetings of clubs or organizations: Not on file    Relationship status: Not on file  Other Topics Concern  . Not on file  Social History Narrative   Lives by herself ; most of family in [place], mother lives near by   Lives in a [address] year old condo. Smoking: denies Occupation: Scientist, water quality History: Water Damage/mildew in the house: no Carpet in the family room: no Carpet in the bedroom: yes Heating: electric Cooling: central Pet: no  Family History: Family History  Problem Relation Age of Onset  . Diabetes Other        uncle  . Hypertension Mother        M, aunt, GM  . Leukemia Other        cousin  . Breast  cancer Other        aunt  . Cancer Maternal Aunt        Lung Cancer  . Cancer Maternal Uncle        Lung Cancer  . CAD Maternal Grandmother   . Colon cancer Neg Hx   . Allergic rhinitis Neg Hx   . Angioedema Neg Hx   . Asthma Neg Hx   . Atopy Neg Hx   . Eczema Neg Hx   . Immunodeficiency Neg Hx   . Urticaria Neg Hx    Review of Systems  Constitutional: Negative for appetite change, chills, fever and unexpected weight change.  HENT: Negative for congestion and rhinorrhea.   Eyes: Negative for itching.  Respiratory: Negative for cough, chest tightness, shortness of breath and wheezing.   Cardiovascular: Negative for chest pain.  Gastrointestinal: Negative for abdominal pain.  Genitourinary: Negative for difficulty urinating.  Skin: Negative for rash.  Neurological: Negative for headaches.   Objective: BP 118/76 (BP Location: Left Arm, Patient Position: Sitting, Cuff Size: Normal) Comment: Post Vitals  Pulse 67   Temp 98.2 F (36.8 C) (Temporal)   Resp 16   Ht 5' 6.5" (1.689 m)   Wt 189 lb (85.7 kg)   SpO2 100%   BMI 30.05 kg/m  Body mass index is 30.05 kg/m. Physical Exam  Constitutional: She is oriented to person, place, and time. She appears well-developed and well-nourished.  HENT:  Head: Normocephalic and atraumatic.  Right Ear: External ear normal.  Left Ear: External ear normal.  Nose: Nose normal.  Mouth/Throat: Oropharynx is clear and moist.  Eyes: Conjunctivae and EOM are normal.  Neck: Neck supple.  Cardiovascular: Normal rate, regular rhythm and normal heart sounds. Exam reveals no gallop and no friction rub.  No murmur heard. Pulmonary/Chest: Effort normal and breath sounds normal. She has no wheezes. She has no rales.  Abdominal: Soft.  Neurological: She is alert and oriented to person, place, and time.  Skin: Skin is warm. No rash noted.  Psychiatric: She has a normal mood and affect. Her behavior is normal.  Nursing note and vitals reviewed.   The plan was reviewed with the patient/family, and all questions/concerned were addressed.  It was my pleasure to see Miranda [name] today and participate in her care. Please feel free to contact me with any questions or concerns.  Sincerely,  Rexene Alberts, DO Allergy & Immunology  [facility] office: [phone] [facility] office: [place] office: [phone]

## 2019-07-18 NOTE — Assessment & Plan Note (Signed)
Patient had large localized reaction with erythema after flu vaccine administration in 2016 which persisted for 3 days. No other associated symptoms. Tolerates eggs and gelatin with no issues.   Administered IM flu vaccine 0.44mL with 15 minutes wait. Patient had no reaction and administered IM flu vaccine 0.30mL with 30 minutes wait.   She experienced mild perioral pruritus which resolved with no medications right after the injection and then noted some mild left upper arm pruritus at the site of the injection. Symptoms of the pruritus was improving with no medications. She was given benadryl 50mg  and symptoms significantly improved after 20 minutes. Vitals were stable throughout. No visible rashes, lungs clear on exam, oropharynx unremarkable.   Patient received full dose of flu vaccine today.   Pruritus can be a sign of IgE mediated reaction however the symptoms were very mild and improved with no medications.   Advised patient to monitor symptoms. For mild symptoms you can take over the counter antihistamines such as Benadryl 25mg  to 50mg  every 4-6 hours as needed and monitor symptoms closely. If symptoms worsen or if you have severe symptoms including breathing issues, throat closure, significant swelling, whole body hives, severe diarrhea and vomiting, lightheadedness then seek immediate medical care.  Recommend that she gets next year's flu vaccine at either our office with 30 minutes wait or the PCP office with 30 minutes wait.   The large localized erythema without pruritus in the past is not as concerning as that is most likely due to the fact she had a strong immune response to the vaccine.

## 2019-07-18 NOTE — Assessment & Plan Note (Signed)
Rhino conjunctivitis symptoms mainly in the spring and summer for many years. Uses Allegra and Flonase with good benefit. No formal allergy evaluation/testing.   May use over the counter antihistamines such as Zyrtec (cetirizine), Claritin (loratadine), Allegra (fexofenadine), or Xyzal (levocetirizine) daily as needed.  May use Flonase 1-2 sprays per nostril daily as needed for nasal congestion.   If symptoms worsen consider allergy testing to environmental allergies in the future.

## 2019-07-18 NOTE — Patient Instructions (Addendum)
For mild symptoms you can take over the counter antihistamines such as Benadryl 25mg  to 50mg  every 4-6 hours as needed and monitor symptoms closely. If symptoms worsen or if you have severe symptoms including breathing issues, throat closure, significant swelling, whole body hives, severe diarrhea and vomiting, lightheadedness then seek immediate medical care.  I'll send a note to your PCP that we gave your your flu vaccine today.  You may get your flu shot next year at out office.  Environmental allergies:  May use over the counter antihistamines such as Zyrtec (cetirizine), Claritin (loratadine), Allegra (fexofenadine), or Xyzal (levocetirizine) daily as needed.  May use Flonase 1-2 sprays per nostril daily as needed for nasal congestion.

## 2019-07-25 LAB — HM HEPATITIS C SCREENING LAB: HM Hepatitis Screen: NEGATIVE

## 2019-07-25 LAB — HM MAMMOGRAPHY

## 2019-07-25 LAB — HEPATITIS B SURFACE ANTIGEN: Hepatitis B Surface Ag: NEGATIVE

## 2019-08-02 ENCOUNTER — Ambulatory Visit (INDEPENDENT_AMBULATORY_CARE_PROVIDER_SITE_OTHER): Payer: Managed Care, Other (non HMO) | Admitting: Internal Medicine

## 2019-08-02 ENCOUNTER — Other Ambulatory Visit: Payer: Self-pay

## 2019-08-02 DIAGNOSIS — Z20822 Contact with and (suspected) exposure to covid-19: Secondary | ICD-10-CM

## 2019-08-02 DIAGNOSIS — Z20828 Contact with and (suspected) exposure to other viral communicable diseases: Secondary | ICD-10-CM | POA: Diagnosis not present

## 2019-08-02 DIAGNOSIS — J069 Acute upper respiratory infection, unspecified: Secondary | ICD-10-CM

## 2019-08-02 NOTE — Progress Notes (Signed)
Subjective:    Patient ID: Miranda [name], female    DOB: [birth year], 43 y.o.   MRN: [id number]  DOS:  08/02/2019 Type of visit - description: Attempted  to make this a video visit, due to technical difficulties from the patient side it was not possible  thus we proceeded with a Virtual Visit via Telephone    I connected with@   by telephone and verified that I am speaking with the correct person using two identifiers.  THIS ENCOUNTER IS A VIRTUAL VISIT DUE TO COVID-19 - PATIENT WAS NOT SEEN IN THE OFFICE. PATIENT HAS CONSENTED TO VIRTUAL VISIT / TELEMEDICINE VISIT   Location of patient: home  Location of provider: office  I discussed the limitations, risks, security and privacy concerns of performing an evaluation and management service by telephone and the availability of in person appointments. I also discussed with the patient that there may be a patient responsible charge related to this service. The patient expressed understanding and agreed to proceed.   History of Present Illness: Acute Symptoms started yesterday with sinus congestion, today in addition she has developed sore throat. She denies fevers or chills but she has been coughing a little.    Review of Systems No nausea, vomiting, diarrhea. No myalgias She did have mild headache ; no rash.  Past Medical History:  Diagnosis Date  . Acne   . Allergic rhinitis   . Fibroids, intramural 10/22/2011  . HA (headache) 07/2008   MRI (-), MRA 2 anerurysms?------> CT angio (-); saw Neuro, likely migraines, Rx imitrex  . Hemorrhoids   . Hypertension     Past Surgical History:  Procedure Laterality Date  . LAPAROTOMY  10/23/2011   Procedure: EXPLORATORY LAPAROTOMY;  Surgeon: Thornell Sartorius, MD;  Location: [facility];  Service: Gynecology;  Laterality: N/A;  thru incision (laparotomy)/Myomyectomy  . WISDOM TOOTH EXTRACTION      Social History   Socioeconomic History  . Marital status: Single    Spouse name: Not on file  .  Number of children: 0  . Years of education: Not on file  . Highest education level: Not on file  Occupational History  . 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 Financial resource strain: Not on file  . Food insecurity    Worry: Not on file    Inability: Not on file  . Transportation needs    Medical: Not on file    Non-medical: Not on file  Tobacco Use  . Smoking status: Never Smoker  . Smokeless tobacco: Never Used  Substance and Sexual Activity  . Alcohol use: Yes    Comment: socially  . Drug use: No  . Sexual activity: Yes    Birth control/protection: Pill  Lifestyle  . Physical activity    Days per week: Not on file    Minutes per session: Not on file  . Stress: Not on file  Relationships  . Social Herbalist on phone: Not on file    Gets together: Not on file    Attends religious service: Not on file    Active member of club or organization: Not on file    Attends meetings of clubs or organizations: Not on file    Relationship status: Not on file  . Intimate partner violence    Fear of current or ex partner: Not on file    Emotionally abused: Not on file    Physically abused: Not on file  Forced sexual activity: Not on file  Other Topics Concern  . Not on file  Social History Narrative   Lives by herself ; most of family in [place], mother lives near by      Allergies as of 08/02/2019   No Known Allergies     Medication List       Accurate as of August 02, 2019 11:37 AM. If you have any questions, ask your nurse or doctor.        ALLEGRA PO Take by mouth daily as needed.   amLODipine 10 MG tablet Commonly known as: NORVASC Take 0.5 tablets (5 mg total) by mouth daily.   carvedilol 6.25 MG tablet Commonly known as: COREG Take 1 tablet (6.25 mg total) by mouth 2 (two) times daily with a meal.   clindamycin 1 % lotion Commonly known as: CLEOCIN T Apply topically 2 (two) times daily.           Objective:   Physical  Exam There were no vitals taken for this visit. This is a virtual phone visit.  She sounded well, alert oriented x3, speaking in complete sentences    Assessment     Assessment   HTN Acne Headaches --- MRI (-), MRA 2 anerurysms?------> CT angio (-); saw Neuro, likely migraines, Rx imitrex Syncope-- 01-2014, 02-2016 (likely vaso-vagal)  Fibroid tumors, not on BCP Integrative therapies: they Rx armour thyroid and testosterone   PLAN: URI: Presents with 1 day history of sinus congestion and sore throat. DDx includes COVID-19. She is already doing Flonase, Mucinex, I also recommend rest, Tylenol as needed. We will check for COVID-19 screening, Recommend quarantine until results are available If test is negative she should be able to go back home once she feels better If test is positive: She needs to be in strict quarantine for 10 to 14 days, okay to go back to work after that (as long as she is asymptomatic and afebrile). Definitely seek attention if she feels much worse, high fever, chest pain, difficulty breathing.   I discussed the assessment and treatment plan with the patient. The patient was provided an opportunity to ask questions and all were answered. The patient agreed with the plan and demonstrated an understanding of the instructions.   The patient was advised to call back or seek an in-person evaluation if the symptoms worsen or if the condition fails to improve as anticipated.  I provided 17 minutes of non-face-to-face time during this encounter.  Kathlene November, MD

## 2019-08-03 ENCOUNTER — Encounter: Payer: Self-pay | Admitting: Internal Medicine

## 2019-08-03 LAB — NOVEL CORONAVIRUS, NAA: SARS-CoV-2, NAA: NOT DETECTED

## 2019-08-03 NOTE — Assessment & Plan Note (Signed)
URI: Presents with 1 day history of sinus congestion and sore throat. DDx includes COVID-19. She is already doing Flonase, Mucinex, I also recommend rest, Tylenol as needed. We will check for COVID-19 screening, Recommend quarantine until results are available If test is negative she should be able to go back home once she feels better If test is positive: She needs to be in strict quarantine for 10 to 14 days, okay to go back to work after that (as long as she is asymptomatic and afebrile). Definitely seek attention if she feels much worse, high fever, chest pain, difficulty breathing.

## 2019-08-08 ENCOUNTER — Encounter: Payer: Self-pay | Admitting: Internal Medicine

## 2019-08-15 ENCOUNTER — Encounter: Payer: Managed Care, Other (non HMO) | Admitting: Internal Medicine

## 2019-08-29 ENCOUNTER — Ambulatory Visit (INDEPENDENT_AMBULATORY_CARE_PROVIDER_SITE_OTHER): Payer: Managed Care, Other (non HMO) | Admitting: Internal Medicine

## 2019-08-29 ENCOUNTER — Encounter: Payer: Self-pay | Admitting: Internal Medicine

## 2019-08-29 ENCOUNTER — Other Ambulatory Visit: Payer: Self-pay

## 2019-08-29 VITALS — BP 134/85 | HR 68 | Temp 97.6°F | Resp 16 | Ht 67.0 in | Wt 194.1 lb

## 2019-08-29 DIAGNOSIS — Z1211 Encounter for screening for malignant neoplasm of colon: Secondary | ICD-10-CM

## 2019-08-29 DIAGNOSIS — D649 Anemia, unspecified: Secondary | ICD-10-CM | POA: Diagnosis not present

## 2019-08-29 DIAGNOSIS — Z0000 Encounter for general adult medical examination without abnormal findings: Secondary | ICD-10-CM

## 2019-08-29 LAB — CBC WITH DIFFERENTIAL/PLATELET
Basophils Absolute: 0.1 10*3/uL (ref 0.0–0.1)
Basophils Relative: 1.5 % (ref 0.0–3.0)
Eosinophils Absolute: 0.1 10*3/uL (ref 0.0–0.7)
Eosinophils Relative: 2.4 % (ref 0.0–5.0)
HCT: 37.3 % (ref 36.0–46.0)
Hemoglobin: 12.1 g/dL (ref 12.0–15.0)
Lymphocytes Relative: 36.3 % (ref 12.0–46.0)
Lymphs Abs: 2.1 10*3/uL (ref 0.7–4.0)
MCHC: 32.5 g/dL (ref 30.0–36.0)
MCV: 84.8 fl (ref 78.0–100.0)
Monocytes Absolute: 0.5 10*3/uL (ref 0.1–1.0)
Monocytes Relative: 8.9 % (ref 3.0–12.0)
Neutro Abs: 2.9 10*3/uL (ref 1.4–7.7)
Neutrophils Relative %: 50.9 % (ref 43.0–77.0)
Platelets: 318 10*3/uL (ref 150.0–400.0)
RBC: 4.4 Mil/uL (ref 3.87–5.11)
RDW: 14.6 % (ref 11.5–15.5)
WBC: 5.7 10*3/uL (ref 4.0–10.5)

## 2019-08-29 LAB — LIPID PANEL
Cholesterol: 202 mg/dL — ABNORMAL HIGH (ref 0–200)
HDL: 54.1 mg/dL (ref >39.00)
LDL Cholesterol: 137 mg/dL — ABNORMAL HIGH (ref 0–99)
NonHDL: 147.96
Total CHOL/HDL Ratio: 4
Triglycerides: 53 mg/dL (ref 0.0–149.0)
VLDL: 10.6 mg/dL (ref 0.0–40.0)

## 2019-08-29 LAB — IRON: Iron: 46 ug/dL (ref 42–145)

## 2019-08-29 LAB — FERRITIN: Ferritin: 16.1 ng/mL (ref 10.0–291.0)

## 2019-08-29 LAB — COMPREHENSIVE METABOLIC PANEL
ALT: 12 U/L (ref 0–35)
AST: 16 U/L (ref 0–37)
Albumin: 4.1 g/dL (ref 3.5–5.2)
Alkaline Phosphatase: 66 U/L (ref 39–117)
BUN: 15 mg/dL (ref 6–23)
CO2: 25 mEq/L (ref 19–32)
Calcium: 9.2 mg/dL (ref 8.4–10.5)
Chloride: 102 mEq/L (ref 96–112)
Creatinine, Ser: 0.72 mg/dL (ref 0.40–1.20)
GFR: 105.64 mL/min (ref >60.00)
Glucose, Bld: 73 mg/dL (ref 70–99)
Potassium: 3.7 mEq/L (ref 3.5–5.1)
Sodium: 136 mEq/L (ref 135–145)
Total Bilirubin: 0.3 mg/dL (ref 0.2–1.2)
Total Protein: 7.5 g/dL (ref 6.0–8.3)

## 2019-08-29 NOTE — Progress Notes (Signed)
Pre visit review using our clinic review tool, if applicable. No additional management support is needed unless otherwise documented below in the visit note. 

## 2019-08-29 NOTE — Patient Instructions (Signed)
GO TO THE LAB : Get the blood work     GO TO THE FRONT DESK Schedule your next appointment   for a physical exam in 1 year    Check the  blood pressure every month BP GOAL is between 110/65 and  135/85. If it is consistently higher or lower, let me know

## 2019-08-29 NOTE — Assessment & Plan Note (Addendum)
--  Tdap 4-18 --Flu shot: h/o localized allergic reaction to the flu shot (2016), saw Dr. Maudie Mercury, allergisty, received a flu shot >> tolerated well, pt reports a mild reaction; going forward, Dr Maudie Mercury ok pt to get the flu shot with 30-minute wait at the doctor's office. --female care: sees Dr Melba Coon --CCS: Patient desires early CCS, options discussed, elected colonoscopy, refer to GI, recommend to check for insurance coverage ahead of the procedure. --Labs: CMP, FLP, CBC, iron, ferritin --Diet and exercise: d/w pt, doing well.

## 2019-08-29 NOTE — Progress Notes (Signed)
Subjective:    Patient ID: Miranda [name], female    DOB: 1976-01-19, 43 y.o.   MRN: [id number]  DOS:  08/29/2019 Type of visit - description: CPX Doing well, has no major concerns   Review of Systems  A 14 point review of systems is negative    Past Medical History:  Diagnosis Date  . Acne   . Allergic rhinitis   . Fibroids, intramural 10/22/2011  . HA (headache) 07/2008   MRI (-), MRA 2 anerurysms?------> CT angio (-); saw Neuro, likely migraines, Rx imitrex  . Hemorrhoids   . Hypertension     Past Surgical History:  Procedure Laterality Date  . LAPAROTOMY  10/23/2011   Procedure: EXPLORATORY LAPAROTOMY;  Surgeon: Thornell Sartorius, MD;  Location: [facility];  Service: Gynecology;  Laterality: N/A;  thru incision (laparotomy)/Myomyectomy  . WISDOM TOOTH EXTRACTION      Social History   Socioeconomic History  . Marital status: Single    Spouse name: Not on file  . Number of children: 0  . Years of education: Not on file  . Highest education level: Not on file  Occupational History  . 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 Financial resource strain: Not on file  . Food insecurity    Worry: Not on file    Inability: Not on file  . Transportation needs    Medical: Not on file    Non-medical: Not on file  Tobacco Use  . Smoking status: Never Smoker  . Smokeless tobacco: Never Used  Substance and Sexual Activity  . Alcohol use: Yes    Comment: socially  . Drug use: No  . Sexual activity: Yes    Birth control/protection: Pill  Lifestyle  . Physical activity    Days per week: Not on file    Minutes per session: Not on file  . Stress: Not on file  Relationships  . Social Herbalist on phone: Not on file    Gets together: Not on file    Attends religious service: Not on file    Active member of club or organization: Not on file    Attends meetings of clubs or organizations: Not on file    Relationship status: Not on file  . Intimate partner  violence    Fear of current or ex partner: Not on file    Emotionally abused: Not on file    Physically abused: Not on file    Forced sexual activity: Not on file  Other Topics Concern  . Not on file  Social History Narrative   Lives by herself ; most of family in [place], mother lives near by     Family History  Problem Relation Age of Onset  . Diabetes Other        uncle  . Hypertension Mother        M, aunt, GM  . Leukemia Other        cousin  . Breast cancer Other        aunt  . Cancer Maternal Aunt        Lung Cancer  . Cancer Maternal Uncle        Lung Cancer  . CAD Maternal Grandmother   . Colon cancer Neg Hx   . Allergic rhinitis Neg Hx   . Angioedema Neg Hx   . Asthma Neg Hx   . Atopy Neg Hx   . Eczema Neg Hx   . Immunodeficiency  Neg Hx   . Urticaria Neg Hx      Allergies as of 08/29/2019   No Known Allergies     Medication List       Accurate as of August 29, 2019 11:59 PM. If you have any questions, ask your nurse or doctor.        ALLEGRA PO Take by mouth daily as needed.   amLODipine 10 MG tablet Commonly known as: NORVASC Take 0.5 tablets (5 mg total) by mouth daily.   carvedilol 6.25 MG tablet Commonly known as: COREG Take 1 tablet (6.25 mg total) by mouth 2 (two) times daily with a meal.   clindamycin 1 % lotion Commonly known as: CLEOCIN T Apply topically 2 (two) times daily.   Ferrex 150 150 MG capsule Generic drug: iron polysaccharides Take 150 mg by mouth daily.           Objective:   Physical Exam BP 134/85 (BP Location: Left Arm, Patient Position: Sitting, Cuff Size: Normal)   Pulse 68   Temp 97.6 F (36.4 C) (Temporal)   Resp 16   Ht 5\' 7"  (1.702 m)   Wt 194 lb 2 oz (88.1 kg)   LMP 08/28/2019 (Exact Date)   SpO2 100%   BMI 30.40 kg/m  General: Well developed, NAD, BMI noted Neck: No  thyromegaly  HEENT:  Normocephalic . Face symmetric, atraumatic Lungs:  CTA B Normal respiratory effort, no intercostal  retractions, no accessory muscle use. Heart: RRR,  no murmur.  No pretibial edema bilaterally  Abdomen:  Not distended, soft, non-tender. No rebound or rigidity. + Mass at the suprapubic area, not tender, (states that had similar findings at gynecology, told it is her uterine fibroids) Skin: Exposed areas without rash. Not pale. Not jaundice Neurologic:  alert & oriented X3.  Speech normal, gait appropriate for age and unassisted Strength symmetric and appropriate for age.  Psych: Cognition and judgment appear intact.  Cooperative with normal attention span and concentration.  Behavior appropriate. No anxious or depressed appearing.     Assessment     Assessment   HTN Acne Headaches --- MRI (-), MRA 2 anerurysms?------> CT angio (-); saw Neuro, likely migraines, Rx imitrex Syncope-- 01-2014, 02-2016 (likely vaso-vagal)  Fibroid tumors, not on BCP Integrative therapies: they Rx armour thyroid and testosterone   PLAN: For CPX HTN: Seems controlled, continue amlodipine, carvedilol. Fibroid tumors: See physical exam, palpable mass suprapubic area, patient reports she had similar findings with she went to gynecology, told mass was her fibroids. Anemia: Recently diagnosed with iron deficiency anemia, felt to be due to DU B.  Checking a CBC, iron, ferritin.  She is on iron by mouth. RTC 1 year   This visit occurred during the SARS-CoV-2 public health emergency.  Safety protocols were in place, including screening questions prior to the visit, additional usage of staff PPE, and extensive cleaning of exam room while observing appropriate contact time as indicated for disinfecting solutions.

## 2019-08-30 NOTE — Assessment & Plan Note (Signed)
For CPX HTN: Seems controlled, continue amlodipine, carvedilol. Fibroid tumors: See physical exam, palpable mass suprapubic area, patient reports she had similar findings with she went to gynecology, told mass was her fibroids. Anemia: Recently diagnosed with iron deficiency anemia, felt to be due to DU B.  Checking a CBC, iron, ferritin.  She is on iron by mouth. RTC 1 year

## 2019-10-03 ENCOUNTER — Encounter: Payer: Self-pay | Admitting: Internal Medicine

## 2019-10-03 ENCOUNTER — Other Ambulatory Visit: Payer: Self-pay

## 2019-10-03 ENCOUNTER — Ambulatory Visit (INDEPENDENT_AMBULATORY_CARE_PROVIDER_SITE_OTHER): Payer: Managed Care, Other (non HMO) | Admitting: Internal Medicine

## 2019-10-03 DIAGNOSIS — R1031 Right lower quadrant pain: Secondary | ICD-10-CM | POA: Diagnosis not present

## 2019-10-03 DIAGNOSIS — R1084 Generalized abdominal pain: Secondary | ICD-10-CM

## 2019-10-03 LAB — POC URINALSYSI DIPSTICK (AUTOMATED)
Bilirubin, UA: NEGATIVE
Glucose, UA: NEGATIVE
Ketones, UA: NEGATIVE
Leukocytes, UA: NEGATIVE
Nitrite, UA: NEGATIVE
Protein, UA: NEGATIVE
Spec Grav, UA: 1.015 (ref 1.010–1.025)
Urobilinogen, UA: 0.2 E.U./dL
pH, UA: 8 (ref 5.0–8.0)

## 2019-10-03 LAB — POCT URINE PREGNANCY: Preg Test, Ur: NEGATIVE

## 2019-10-03 NOTE — Progress Notes (Signed)
Subjective:    Patient ID: Miranda [name], female    DOB: Nov 12, 1975, 44 y.o.   MRN: [id number]  DOS:  10/03/2019 Type of visit - description: Attempted  to make this a video visit, due to technical difficulties from the patient side it was not possible  thus we proceeded with a Virtual Visit via Telephone    I connected with above mentioned patient  by telephone and verified that I am speaking with the correct person using two identifiers.  THIS ENCOUNTER IS A VIRTUAL VISIT DUE TO COVID-19 - PATIENT WAS NOT SEEN IN THE OFFICE. PATIENT HAS CONSENTED TO VIRTUAL VISIT / TELEMEDICINE VISIT   Location of patient: home  Location of provider: office  I discussed the limitations, risks, security and privacy concerns of performing an evaluation and management service by telephone and the availability of in person appointments. I also discussed with the patient that there may be a patient responsible charge related to this service. The patient expressed understanding and agreed to proceed.    History of Present Illness:  Acute Last week, she was constipated 1 time, took a laxative and felt better. On 09/30/2019 she had a mild periumbilical discomfort that self resolved On 10/02/2019 she develop left-sided pain, it subsided spontaneously. Today, she woke up with steady, mild, right lower quadrant pain.  Intensity is 2/10.  Review of Systems Last BM was 2 days ago.   Denies dysuria, gross hematuria or difficulty urinating. Last menstrual period started 09/21/2019, she is still spotting.  She is not on birth control, not sexually active. No vaginal discharge or vaginal bleeding. No fever chills Appetite is somewhat decreased.  Past Medical History:  Diagnosis Date  . Acne   . Allergic rhinitis   . Fibroids, intramural 10/22/2011  . HA (headache) 07/2008   MRI (-), MRA 2 anerurysms?------> CT angio (-); saw Neuro, likely migraines, Rx imitrex  . Hemorrhoids   . Hypertension     Past  Surgical History:  Procedure Laterality Date  . LAPAROTOMY  10/23/2011   Procedure: EXPLORATORY LAPAROTOMY;  Surgeon: Thornell Sartorius, MD;  Location: [facility];  Service: Gynecology;  Laterality: N/A;  thru incision (laparotomy)/Myomyectomy  . WISDOM TOOTH EXTRACTION      Social History   Socioeconomic History  . Marital status: Single    Spouse name: Not on file  . Number of children: 0  . Years of education: Not on file  . Highest education level: Not on file  Occupational History  . 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 Smoking status: Never Smoker  . Smokeless tobacco: Never Used  Substance and Sexual Activity  . Alcohol use: Yes    Comment: socially  . Drug use: No  . Sexual activity: Yes    Birth control/protection: Pill  Other Topics Concern  . Not on file  Social History Narrative   Lives by herself ; most of family in [place], mother lives near by   Social Determinants of Health   Financial Resource Strain:   . Difficulty of Paying Living Expenses: Not on file  Food Insecurity:   . Worried About Charity fundraiser in the Last Year: Not on file  . Ran Out of Food in the Last Year: Not on file  Transportation Needs:   . Lack of Transportation (Medical): Not on file  . Lack of Transportation (Non-Medical): Not on file  Physical Activity:   . Days of Exercise per Week: Not on file  .  Minutes of Exercise per Session: Not on file  Stress:   . Feeling of Stress : Not on file  Social Connections:   . Frequency of Communication with Friends and Family: Not on file  . Frequency of Social Gatherings with Friends and Family: Not on file  . Attends Religious Services: Not on file  . Active Member of Clubs or Organizations: Not on file  . Attends Archivist Meetings: Not on file  . Marital Status: Not on file  Intimate Partner Violence:   . Fear of Current or Ex-Partner: Not on file  . Emotionally Abused: Not on file  . Physically Abused: Not on file  .  Sexually Abused: Not on file      Allergies as of 10/03/2019   No Known Allergies     Medication List       Accurate as of October 03, 2019 11:59 PM. If you have any questions, ask your nurse or doctor.        ALLEGRA PO Take by mouth daily as needed.   amLODipine 10 MG tablet Commonly known as: NORVASC Take 0.5 tablets (5 mg total) by mouth daily.   carvedilol 6.25 MG tablet Commonly known as: COREG Take 1 tablet (6.25 mg total) by mouth 2 (two) times daily with a meal.   clindamycin 1 % lotion Commonly known as: CLEOCIN T Apply topically 2 (two) times daily.   Ferrex 150 150 MG capsule Generic drug: iron polysaccharides Take 150 mg by mouth daily.           Objective:   Physical Exam There were no vitals taken for this visit. This is phone virtual visit.  She is alert oriented x3, in no distress    Assessment     Assessment   HTN Acne Headaches --- MRI (-), MRA 2 anerurysms?------> CT angio (-); saw Neuro, likely migraines, Rx imitrex Syncope-- 01-2014, 02-2016 (likely vaso-vagal)  Fibroid tumors, not on BCP Integrative therapies: they Rx armour thyroid and testosterone   PLAN: Right abdominal pain: Patient knows the limitations of a phone evaluation, we discussed going to urgent care for further testing or testing in my office. She elected to start the work-up at my office. DDx is large but includes early acute appendicitis, UTI, ovarian cyst, or others. Plan:  UPT, UA, urine culture, stat CBC and CMP. She is aware that based on the results I may answer and then to go to the ER. She is to monitor her symptoms carefully, if she has severe pain, fever, chills: Needs to go to the ER. Addendum 10/04/2019: Blood work looks good except for minimal anemia with a hemoglobin of 11, urinalysis showed possibly a mild UTI. I spoke with the patient: Abdominal pain decreased today, no fever chills (not taking NSAIDs or Tylenol) no nausea, vomiting, no LUTS.  Appetite is  better Plan: Continue observing symptoms, antibiotics if UCX came back positive     I discussed the assessment and treatment plan with the patient. The patient was provided an opportunity to ask questions and all were answered. The patient agreed with the plan and demonstrated an understanding of the instructions.   The patient was advised to call back or seek an in-person evaluation if the symptoms worsen or if the condition fails to improve as anticipated.  I provided 30 minutes of non-face-to-face time during this encounter.  Kathlene November, MD

## 2019-10-04 ENCOUNTER — Encounter: Payer: Self-pay | Admitting: Internal Medicine

## 2019-10-04 LAB — CBC WITH DIFFERENTIAL/PLATELET
Basophils Absolute: 0.1 10*3/uL (ref 0.0–0.1)
Basophils Relative: 1.4 % (ref 0.0–3.0)
Eosinophils Absolute: 0.1 10*3/uL (ref 0.0–0.7)
Eosinophils Relative: 1.7 % (ref 0.0–5.0)
HCT: 34.6 % — ABNORMAL LOW (ref 36.0–46.0)
Hemoglobin: 11 g/dL — ABNORMAL LOW (ref 12.0–15.0)
Lymphocytes Relative: 40.3 % (ref 12.0–46.0)
Lymphs Abs: 2.1 10*3/uL (ref 0.7–4.0)
MCHC: 31.7 g/dL (ref 30.0–36.0)
MCV: 85.8 fl (ref 78.0–100.0)
Monocytes Absolute: 0.6 10*3/uL (ref 0.1–1.0)
Monocytes Relative: 11.8 % (ref 3.0–12.0)
Neutro Abs: 2.4 10*3/uL (ref 1.4–7.7)
Neutrophils Relative %: 44.8 % (ref 43.0–77.0)
Platelets: 460 10*3/uL — ABNORMAL HIGH (ref 150.0–400.0)
RBC: 4.03 Mil/uL (ref 3.87–5.11)
RDW: 14.5 % (ref 11.5–15.5)
WBC: 5.3 10*3/uL (ref 4.0–10.5)

## 2019-10-04 LAB — COMPREHENSIVE METABOLIC PANEL
ALT: 25 U/L (ref 0–35)
AST: 23 U/L (ref 0–37)
Albumin: 4.1 g/dL (ref 3.5–5.2)
Alkaline Phosphatase: 67 U/L (ref 39–117)
BUN: 10 mg/dL (ref 6–23)
CO2: 25 mEq/L (ref 19–32)
Calcium: 9.1 mg/dL (ref 8.4–10.5)
Chloride: 105 mEq/L (ref 96–112)
Creatinine, Ser: 0.78 mg/dL (ref 0.40–1.20)
GFR: 96.27 mL/min (ref >60.00)
Glucose, Bld: 79 mg/dL (ref 70–99)
Potassium: 4.1 mEq/L (ref 3.5–5.1)
Sodium: 137 mEq/L (ref 135–145)
Total Bilirubin: 0.3 mg/dL (ref 0.2–1.2)
Total Protein: 7 g/dL (ref 6.0–8.3)

## 2019-10-04 LAB — URINE CULTURE
MICRO NUMBER:: 10003798
SPECIMEN QUALITY:: ADEQUATE

## 2019-10-04 LAB — URINALYSIS, MICROSCOPIC ONLY

## 2019-10-05 ENCOUNTER — Telehealth: Payer: Self-pay | Admitting: *Deleted

## 2019-10-05 NOTE — Telephone Encounter (Signed)
Copied from [place] [id number]. Topic: General - Other >> Oct 05, 2019  2:58 PM Rainey Pines A wrote: Patient would like a callback today to go over lab results. Please advise

## 2019-10-05 NOTE — Assessment & Plan Note (Signed)
Right abdominal pain: Patient knows the limitations of a phone evaluation, we discussed going to urgent care for further testing or testing in my office. She elected to start the work-up at my office. DDx is large but includes early acute appendicitis, UTI, ovarian cyst, or others. Plan:  UPT, UA, urine culture, stat CBC and CMP. She is aware that based on the results I may answer and then to go to the ER. She is to monitor her symptoms carefully, if she has severe pain, fever, chills: Needs to go to the ER. Addendum 10/04/2019: Blood work looks good except for minimal anemia with a hemoglobin of 11, urinalysis showed possibly a mild UTI. I spoke with the patient: Abdominal pain decreased today, no fever chills (not taking NSAIDs or Tylenol) no nausea, vomiting, no LUTS.  Appetite is better Plan: Continue observing symptoms, antibiotics if UCX came back positive

## 2019-10-07 NOTE — Telephone Encounter (Signed)
See lab note.  Patient was sent mychart message

## 2019-10-10 ENCOUNTER — Other Ambulatory Visit: Payer: Self-pay | Admitting: Internal Medicine

## 2019-11-10 ENCOUNTER — Telehealth: Payer: Self-pay | Admitting: Allergy

## 2019-11-10 NOTE — Telephone Encounter (Signed)
Patient is on the schedule for 12/12/18 and has been informed not to take any antihistamines 3 days prior to her appointment.

## 2019-11-10 NOTE — Telephone Encounter (Signed)
Please schedule patient for testing.  Make sure she knows no antihistamines for 3 days and plan on being in the office for about 4 hours. Thank you.

## 2019-11-10 NOTE — Telephone Encounter (Signed)
Patient called in stating she had the flu shot back in October and she had a reaction.  She had lip itching and itching around injection site and was given benadryl in office.  She is wanting to know if she will beable to get the COVID shot as she is worried about a possible reaction.  She can be reached at [phone]

## 2019-11-10 NOTE — Telephone Encounter (Signed)
Patient was seen 06/2019 for Flu vaccine reaction, had office challenge and stated she developed itching around the site and was hot at the site. Patient was instructed to not get the COVID vaccine per COVID-19 Algorithm protocol until we get a clear understanding if patient needs to be tested. Patient was placed on the COVID-19 testing wait list. Please advise on this.

## 2019-11-27 ENCOUNTER — Encounter: Payer: Self-pay | Admitting: Internal Medicine

## 2019-11-29 ENCOUNTER — Other Ambulatory Visit: Payer: Self-pay

## 2019-12-06 ENCOUNTER — Ambulatory Visit: Payer: Managed Care, Other (non HMO) | Admitting: Internal Medicine

## 2019-12-06 ENCOUNTER — Encounter: Payer: Self-pay | Admitting: Internal Medicine

## 2019-12-06 ENCOUNTER — Other Ambulatory Visit: Payer: Self-pay

## 2019-12-06 VITALS — BP 143/70 | HR 85 | Temp 97.8°F | Resp 16 | Ht 67.0 in | Wt 196.0 lb

## 2019-12-06 DIAGNOSIS — R1030 Lower abdominal pain, unspecified: Secondary | ICD-10-CM

## 2019-12-06 NOTE — Progress Notes (Signed)
Subjective:    Patient ID: Miranda [name], female    DOB: [birth year], 44 y.o.   MRN: [id number]  DOS:  12/06/2019 Type of visit - description: Follow-up She was seen in January with lower abdominal discomfort. CBC showed mild anemia, urine showed few bacteria, RBCs and white cells but urine culture was eventually negative.    Since then, she continue with lower abdominal pain, mostly on the right, occasionally on the left. Pain is now on and off, mild, has not required any medication for it, able to do all his normal activities with the pain.  She saw gynecology, reportedly had pelvic ultrasound, reportedly showed her well known uterine fibroids    Review of Systems Denies fever chills.  No weight loss No nausea, vomiting, diarrhea. Up to 6 months ago she was very regular but now she is getting constipated on and off, sometimes no BMs in 2 to 3 days. Menstrual periods are heavy but regular q 28 days. No dysuria, gross hematuria, difficulty urinating.   Past Medical History:  Diagnosis Date  . Acne   . Allergic rhinitis   . Fibroids, intramural 10/22/2011  . HA (headache) 07/2008   MRI (-), MRA 2 anerurysms?------> CT angio (-); saw Neuro, likely migraines, Rx imitrex  . Hemorrhoids   . Hypertension     Past Surgical History:  Procedure Laterality Date  . LAPAROTOMY  10/23/2011   Procedure: EXPLORATORY LAPAROTOMY;  Surgeon: Thornell Sartorius, MD;  Location: [facility];  Service: Gynecology;  Laterality: N/A;  thru incision (laparotomy)/Myomyectomy  . WISDOM TOOTH EXTRACTION      Allergies as of 12/06/2019   No Known Allergies     Medication List       Accurate as of December 06, 2019  4:35 PM. If you have any questions, ask your nurse or doctor.        ALLEGRA PO Take by mouth daily as needed.   amLODipine 10 MG tablet Commonly known as: NORVASC Take 0.5 tablets (5 mg total) by mouth daily.   carvedilol 6.25 MG tablet Commonly known as: COREG TAKE 1 TABLET (6.25 MG TOTAL) BY  MOUTH 2 (TWO) TIMES DAILY WITH A MEAL.   clindamycin 1 % lotion Commonly known as: CLEOCIN T Apply topically 2 (two) times daily.   Ferrex 150 150 MG capsule Generic drug: iron polysaccharides Take 150 mg by mouth daily.          Objective:   Physical Exam Abdominal:      BP (!) 143/70 (BP Location: Left Arm, Patient Position: Sitting, Cuff Size: Normal)   Pulse 85   Temp 97.8 F (36.6 C) (Temporal)   Resp 16   Ht 5\' 7"  (1.702 m)   Wt 196 lb (88.9 kg)   LMP 11/09/2019 (Exact Date)   SpO2 100%   BMI 30.70 kg/m  General:   Well developed, NAD, BMI noted.  HEENT:  Normocephalic . Face symmetric, atraumatic MSK: No TTP at the lumbosacral spine, no CVA tenderness. Abdomen:  Not distended, + suprapubic mass, likely due to large fibroids.  Not tender.  Normal bowel sounds. Groins: No hernias. Skin: Not pale. Not jaundice Lower extremities: no pretibial edema bilaterally  Neurologic:  alert & oriented X3.  Speech normal, gait appropriate for age and unassisted Psych--  Cognition and judgment appear intact.  Cooperative with normal attention span and concentration.  Behavior appropriate. No anxious or depressed appearing.     Assessment      Assessment   HTN  Acne Headaches --- MRI (-), MRA 2 anerurysms?------> CT angio (-); saw Neuro, likely migraines, Rx imitrex Syncope-- 01-2014, 02-2016 (likely vaso-vagal)  Fibroid tumors, not on BCP Integrative therapies: they Rx armour thyroid and testosterone   PLAN: Chronic lower abdominal pain, R>L: As described above.  On exam, she has a firm mass at the suprapubic area most likely from well-known history of large fibroids, her gyn does not believe pain related to fibroids. Review of systems: + for constipation for the last few months. She has mild anemia, + heavy periods Plan: Recheck a BMP, UA, urine culture, UPT CT abdomen after labs.   GI referral (44 year old, recently started with constipation)   This visit  occurred during the SARS-CoV-2 public health emergency.  Safety protocols were in place, including screening questions prior to the visit, additional usage of staff PPE, and extensive cleaning of exam room while observing appropriate contact time as indicated for disinfecting solutions.

## 2019-12-06 NOTE — Patient Instructions (Signed)
Please make an appointment for tomorrow or the next day to get blood and urine tests.  After that we will proceed with a CAT scan  I am referring you to the gastroenterologist

## 2019-12-06 NOTE — Progress Notes (Signed)
Pre visit review using our clinic review tool, if applicable. No additional management support is needed unless otherwise documented below in the visit note. 

## 2019-12-07 NOTE — Assessment & Plan Note (Signed)
Chronic lower abdominal pain, R>L: As described above.  On exam, she has a firm mass at the suprapubic area most likely from well-known history of large fibroids, her gyn does not believe pain related to fibroids. Review of systems: + for constipation for the last few months. She has mild anemia, + heavy periods Plan: Recheck a BMP, UA, urine culture, UPT CT abdomen after labs.   GI referral (44 year old, recently started with constipation)

## 2019-12-08 ENCOUNTER — Other Ambulatory Visit: Payer: Self-pay

## 2019-12-08 ENCOUNTER — Other Ambulatory Visit (INDEPENDENT_AMBULATORY_CARE_PROVIDER_SITE_OTHER): Payer: Managed Care, Other (non HMO)

## 2019-12-08 DIAGNOSIS — R1030 Lower abdominal pain, unspecified: Secondary | ICD-10-CM

## 2019-12-08 LAB — URINALYSIS, ROUTINE W REFLEX MICROSCOPIC
Bilirubin Urine: NEGATIVE
Ketones, ur: NEGATIVE
Leukocytes,Ua: NEGATIVE
Nitrite: NEGATIVE
Specific Gravity, Urine: 1.015 (ref 1.000–1.030)
Total Protein, Urine: NEGATIVE
Urine Glucose: NEGATIVE
Urobilinogen, UA: 0.2 (ref 0.0–1.0)
pH: 6 (ref 5.0–8.0)

## 2019-12-08 LAB — BASIC METABOLIC PANEL
BUN: 12 mg/dL (ref 6–23)
CO2: 23 mEq/L (ref 19–32)
Calcium: 8.9 mg/dL (ref 8.4–10.5)
Chloride: 102 mEq/L (ref 96–112)
Creatinine, Ser: 0.8 mg/dL (ref 0.40–1.20)
GFR: 93.43 mL/min (ref >60.00)
Glucose, Bld: 86 mg/dL (ref 70–99)
Potassium: 3.8 mEq/L (ref 3.5–5.1)
Sodium: 133 mEq/L — ABNORMAL LOW (ref 135–145)

## 2019-12-09 LAB — URINE CULTURE
MICRO NUMBER:: 10240772
SPECIMEN QUALITY:: ADEQUATE

## 2019-12-09 LAB — PREGNANCY, URINE: Preg Test, Ur: NEGATIVE

## 2019-12-12 ENCOUNTER — Other Ambulatory Visit: Payer: Self-pay

## 2019-12-12 ENCOUNTER — Encounter: Payer: Self-pay | Admitting: Allergy

## 2019-12-12 ENCOUNTER — Ambulatory Visit: Payer: Managed Care, Other (non HMO) | Admitting: Allergy

## 2019-12-12 VITALS — BP 134/88 | HR 66 | Temp 97.4°F | Resp 20 | Ht 66.0 in | Wt 197.0 lb

## 2019-12-12 DIAGNOSIS — T50905A Adverse effect of unspecified drugs, medicaments and biological substances, initial encounter: Secondary | ICD-10-CM | POA: Insufficient documentation

## 2019-12-12 DIAGNOSIS — T50905D Adverse effect of unspecified drugs, medicaments and biological substances, subsequent encounter: Secondary | ICD-10-CM

## 2019-12-12 NOTE — Patient Instructions (Addendum)
   Today's skin prick testing was negative toMiralax (source of PEG 3350), methylprednisolone acetate(source of PEG 3350), and triamcinolone acetonide(source of polysorbate-80).  Today's intradermal testing was negative to triamcinolone acetonide and methylprednisolone acetate.  PassedoralMiralaxchallenge part.   Discussed with patient thatideally after a negative skin testing a test dose of the vaccine is given in the office but as we do not have access to the vaccine we were unable to do that part.  Her risk of having a reaction to the COVID-19 vaccine given her clinical history and today's testing results is low.  Recommendations:   Get COVID-19 vaccine when available to you.   Take zyrtec (cetirizine) 10mg  1 tablet about 1-2 hours before the vaccine.  Wait 30 minutes after the injection.  Follow up as needed.

## 2019-12-12 NOTE — Addendum Note (Signed)
Addended by: Wynonia Musty A on: 12/12/2019 07:17 AM   Modules accepted: Orders

## 2019-12-12 NOTE — Progress Notes (Signed)
Follow Up Note  RE: Miranda [name] MRN: [id number] DOB: [birth year] Date of Office Visit: 12/12/2019  Referring provider: Colon Branch, MD Primary care provider: Colon Branch, MD  Chief Complaint: Food/Drug Challenge (Component Testing)  Assessment and Plan: Miranda [name] is a 44 y.o. female with: Drug reaction Patient concerned about getting the COVID-19 vaccine due to her history of large localized reactions to flu vaccine. No other known reactions to polyethylene glycol, polysorbate or miralax.   Today's skin prick testing was negative toMiralax (source of PEG 3350), methylprednisolone acetate(source of PEG 3350), and triamcinolone acetonide(source of polysorbate-80).  Today's intradermal testing was negative to triamcinolone acetonide and methylprednisolone acetate.  PassedoralMiralaxchallenge part.   Discussed with patient thatideally after a negative skin testing a test dose of the vaccine is given in the office but as we do not have access to the vaccine we were unable to do that part.  Her risk of having a reaction to the COVID-19 vaccine given her clinical history and today's testing results is low. Recommendations:   Get COVID-19 vaccine when available to you.   Take zyrtec (cetirizine) 10mg  1 tablet about 1-2 hours before the vaccine.  Wait 30 minutes after the injection.  Letter written for patient to take to the vaccine site.   Return if symptoms worsen or fail to improve.  Plan: Challenge drug: miralax Challenge as per protocol: Passed Total time: 64 min  For next 24 hours monitor for hives, swelling, shortness of breath and dizziness. If you see these symptoms, use Benadryl for mild symptoms and epinephrine for more severe symptoms and call 911.  If no adverse symptoms in the next 24 hours then will take off drug from allergy list.  History of Present Illness: I had the pleasure of seeing Miranda [name] for a follow up visit at the [facility] on 12/12/2019. She is a 44 y.o. 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 Today she is here for COVID-19 component testing Her previous allergy office visit was on 07/18/2019 with Dr. Maudie Mercury.   History of Reaction:  Any known reactions to polyethylene glycol or polysorbate?  Denies any history of reactions except for the flu vaccine. Had large localized reactions in the past.  Patient does come in contact with polysorbate 80, 20 at work. No issues that she is aware of.  Any history of anaphylaxis to vaccinations, including a COVID19 vaccine?  Patient has not received the COVID-19 vaccine yet. Besides the flu vaccine, she has tolerated other vaccines with no issues.  Any history of reactions to injectable medications? No. No prior exposure to steroid injections.   Any history of anaphylaxis to colonoscopy preps (i.e.Miralax)? No previous colonoscopy and no known miralax exposure.   Any history of dermal filler treatments in the last year? No.  Interval History: Patient has not been ill, she has not had any accidental exposures to the culprit medication.   Recent/Current History: Pulmonary disease: no Cardiac disease: no Respiratory infection: no Rash: no Itch: no Swelling: no Cough: no Shortness of breath: no Runny/stuffy nose: no Itchy eyes: no Beta-blocker use: no  Patient/guardian was informed of the test procedure with verbalized understanding of the risk of anaphylaxis.   Last antihistamine use: none in the past 3 days.  Last beta-blocker use: n/a  Medication List:  Current Outpatient Medications  Medication Sig Dispense Refill  . amLODipine (NORVASC) 10 MG tablet Take 0.5 tablets (5 mg total) by mouth  daily. 45 tablet 3  . carvedilol (COREG) 6.25 MG tablet TAKE 1 TABLET (6.25 MG TOTAL) BY MOUTH 2 (TWO) TIMES DAILY WITH A MEAL. 180 tablet 3  . clindamycin (CLEOCIN T) 1 % lotion Apply topically 2 (two) times daily.    Marland Kitchen FERREX 150 150 MG  capsule Take 150 mg by mouth daily.    Marland Kitchen Fexofenadine HCl (ALLEGRA PO) Take by mouth daily as needed.     No current facility-administered medications for this visit.   Allergies: No Known Allergies I reviewed her past medical history, social history, family history, and environmental history and no significant changes have been reported from her previous visit.  Review of Systems  Constitutional: Negative for appetite change, chills, fever and unexpected weight change.  HENT: Negative for congestion and rhinorrhea.   Eyes: Negative for itching.  Respiratory: Negative for cough, chest tightness, shortness of breath and wheezing.   Cardiovascular: Negative for chest pain.  Gastrointestinal: Negative for abdominal pain.  Genitourinary: Negative for difficulty urinating.  Skin: Negative for rash.  Neurological: Negative for headaches.   Objective: BP 134/88 (BP Location: Left Arm, Patient Position: Sitting, Cuff Size: Normal)   Pulse 66   Temp (!) 97.4 F (36.3 C) (Temporal)   Resp 20   Ht 5\' 6"  (1.676 m)   Wt 197 lb (89.4 kg)   SpO2 100%   BMI 31.80 kg/m  Body mass index is 31.8 kg/m. Physical Exam  Constitutional: She is oriented to person, place, and time. She appears well-developed and well-nourished.  HENT:  Head: Normocephalic and atraumatic.  Right Ear: External ear normal.  Left Ear: External ear normal.  Nose: Nose normal.  Mouth/Throat: Oropharynx is clear and moist.  Eyes: Conjunctivae and EOM are normal.  Cardiovascular: Normal rate, regular rhythm and normal heart sounds. Exam reveals no gallop and no friction rub.  No murmur heard. Pulmonary/Chest: Effort normal and breath sounds normal. She has no wheezes. She has no rales.  Abdominal: Soft.  Musculoskeletal:     Cervical back: Neck supple.  Neurological: She is alert and oriented to person, place, and time.  Skin: Skin is warm. No rash noted.  Psychiatric: She has a normal mood and affect. Her behavior  is normal.  Nursing note and vitals reviewed.   Diagnostics: Skin Testing: Histamine: 2+ Control: 0 Triamcinolone acetonide: 0 Methylprednisolone acetate: 0 Miralax (1:100): 0 Miralax (1:10): 0 Miralax (1:1): 0  Intradermals: Control - 0 Triamcinolone acetonide (1:100): 0 Methylprednisolone acetate (1:100): 0 Triamcinolone acetonide (1:10): 0 Methylprednisolone acetate (1:10): 0 Triamcinolone acetonide (1:11): 0  Results discussed with patient/family. Oral Challenge - 12/12/19 1100    Challenge Food/Drug  Miralax    Lot #  if Applicable  [id number]    Food/Drug provided by  office    BP  122/74    Pulse  72    Respirations  18    Lungs  clear    Skin  clear    Mouth  clear    Time  1120    Dose  0.3 ML    Lungs  clear    Skin  clear    Mouth  'clear    Time  1137    Dose  69mL    Lungs  clear    Skin  clear    Mouth  clear    Time  1154    Dose  43mL    Lungs  clear    Skin  clear    Mouth  clear    Time  1224    Dose  observation    BP  132/84    Pulse  74    Respirations  18    Lungs  clear    Skin  clear    Mouth  clear       Previous notes and tests were reviewed. The plan was reviewed with the patient/family, and all questions/concerned were addressed.  It was my pleasure to see Tyauna today and participate in her care. Please feel free to contact me with any questions or concerns.  Sincerely,  Rexene Alberts, DO Allergy & Immunology  [facility] office: [phone] [place] office: [phone]

## 2019-12-12 NOTE — Assessment & Plan Note (Signed)
Patient concerned about getting the COVID-19 vaccine due to her history of large localized reactions to flu vaccine. No other known reactions to polyethylene glycol, polysorbate or miralax.   Today's skin prick testing was negative toMiralax (source of PEG 3350), methylprednisolone acetate(source of PEG 3350), and triamcinolone acetonide(source of polysorbate-80).  Today's intradermal testing was negative to triamcinolone acetonide and methylprednisolone acetate.  PassedoralMiralaxchallenge part.   Discussed with patient thatideally after a negative skin testing a test dose of the vaccine is given in the office but as we do not have access to the vaccine we were unable to do that part.  Her risk of having a reaction to the COVID-19 vaccine given her clinical history and today's testing results is low. Recommendations:   Get COVID-19 vaccine when available to you.   Take zyrtec (cetirizine) 10mg  1 tablet about 1-2 hours before the vaccine.  Wait 30 minutes after the injection.  Letter written for patient to take to the vaccine site.

## 2019-12-13 ENCOUNTER — Other Ambulatory Visit: Payer: Managed Care, Other (non HMO)

## 2019-12-20 ENCOUNTER — Telehealth: Payer: Self-pay | Admitting: Internal Medicine

## 2019-12-20 NOTE — Telephone Encounter (Signed)
They've tried contacting her I believe.

## 2019-12-20 NOTE — Telephone Encounter (Signed)
Was CT abdomen ever done?  Results? Please contact the patient and let me know.

## 2019-12-20 NOTE — Telephone Encounter (Signed)
thx

## 2019-12-20 NOTE — Telephone Encounter (Signed)
Spoke w/ Pt- she has tried calling the scheduling department twice to check on price- she has left messages at the number Hoyle Sauer has given her w/ no return call. I recommended she call back to [facility] at [phone] and let them know that she has not received a call back. Pt verbalized understanding. Number to [facility] imaging department given to Pt.

## 2019-12-23 ENCOUNTER — Telehealth: Payer: Self-pay | Admitting: Internal Medicine

## 2019-12-23 NOTE — Telephone Encounter (Signed)
Caller: Cabin crew # [phone]   Requesting that patient's CT order be faxed to [facility]   For scheduling   Fax Number # [phone]

## 2019-12-23 NOTE — Telephone Encounter (Signed)
Faxed order requisition and Auth # to [facility].

## 2019-12-26 NOTE — Telephone Encounter (Signed)
Scheduled 12/30/2019 at [facility].

## 2019-12-27 ENCOUNTER — Encounter: Payer: Self-pay | Admitting: Nurse Practitioner

## 2019-12-28 ENCOUNTER — Ambulatory Visit (HOSPITAL_BASED_OUTPATIENT_CLINIC_OR_DEPARTMENT_OTHER): Payer: Managed Care, Other (non HMO)

## 2020-01-02 ENCOUNTER — Encounter: Payer: Self-pay | Admitting: Internal Medicine

## 2020-01-11 ENCOUNTER — Ambulatory Visit: Payer: Managed Care, Other (non HMO) | Admitting: Nurse Practitioner

## 2020-01-11 ENCOUNTER — Encounter: Payer: Self-pay | Admitting: Nurse Practitioner

## 2020-01-11 VITALS — BP 128/88 | HR 64 | Temp 97.9°F | Ht 66.0 in | Wt 194.0 lb

## 2020-01-11 DIAGNOSIS — R1031 Right lower quadrant pain: Secondary | ICD-10-CM

## 2020-01-11 DIAGNOSIS — K625 Hemorrhage of anus and rectum: Secondary | ICD-10-CM

## 2020-01-11 DIAGNOSIS — Z1211 Encounter for screening for malignant neoplasm of colon: Secondary | ICD-10-CM | POA: Diagnosis not present

## 2020-01-11 NOTE — Patient Instructions (Signed)
If you are age 46 or older, your body mass index should be between 23-30. Your Body mass index is 31.31 kg/m. If this is out of the aforementioned range listed, please consider follow up with your Primary Care Provider.  If you are age 25 or younger, your body mass index should be between 19-25. Your Body mass index is 31.31 kg/m. If this is out of the aformentioned range listed, please consider follow up with your Primary Care Provider.   Please call us back regarding a date for your colonoscopy with Dr. Silverio Decamp.  Thank you for choosing me and [facility].   Tye Savoy, NP

## 2020-01-11 NOTE — Progress Notes (Signed)
ASSESSMENT / PLAN:   44 year old female with pmh significant for uterine fibroids, iron deficiency anemia, HTN  # RLQ pain, resolved --Lasted for a couple of months then spontaneously resolved 2 weeks ago.  --CT scan by PCP ( done at outside facility) was unrevealing except for fibroids and increased stool in colon ( per PCP phone note) --Further evaluation if pain recurs  # Colon cancer screening --Patient will be scheduled for a colonoscopy. The risks and benefits of colonoscopy with possible polypectomy / biopsies were discussed and the patient agrees to proceed.   # Chronic, mild intermittent and painless rectal bleeding --Bleeding sounds to be minor --Attributed in past to hemorrhoids --Further evaluation at time of screening colonoscopy   # Chronic iron deficiency anemia --Attributed to uterine bleeding --history of fibroids.  She had a myomectomy in 2013. Has recurrent large fibroids.  --On oral iron   HPI:     Chief Complaint: Abdminal pain (now resolved)   Miranda [name] is a 44 year old female referred by PCP for evaluation of abdominal pain.  Patient began having periumbilical pain sometime around January. The pain quickly migrated to the LLQ then to the RLQ where it remained until just a couple of weeks ago. She hasn't had any pain since. Patient was unable to correlate the pain with menstrual cycles which can be heavy due to fibroids. The pain was not related to bowel movements, eating, or activity.  Episodes were pretty much random, generally lasting a few hours before spontaneously subsiding.  She did notice onset of increased intestinal gas with belching /  Bloating once the pain would start. Expelling gas didn't help the pain however. She didn't get nauseated with the pain.   Patient has a history of fibroids and thought lower abdominal pain may be related to those. She saw GYN and says an ultrasound was done.  Ultimately pain in RLQ not felt by GYN  to be secondary to fibroids.  Patient has also been seeing PCP for the pain.  She says he ordered a CT scan which was basically unremarkable except for increased stool.  She was started on daily MiraLAX about a week ago.  Since starting MiraLAX she has gone from having 1 bowel movement every 3 to 4 days to now every other day.  She gives a history of chronic, intermittent painless rectal bleeding attributed to hemorrhoids in the past.  In early January 2021 her hgb was 11, down from baseline of ~ 12. MCV 85. CMET was normal.  Of note she has a history of IDA with ferritin of 7 in 2019 and 16 last November.     Past Medical History:  Diagnosis Date  . Acne   . Allergic rhinitis   . Fibroids, intramural 10/22/2011  . HA (headache) 07/2008   MRI (-), MRA 2 anerurysms?------> CT angio (-); saw Neuro, likely migraines, Rx imitrex  . Hemorrhoids   . Hypertension      Past Surgical History:  Procedure Laterality Date  . LAPAROTOMY  10/23/2011   Procedure: EXPLORATORY LAPAROTOMY;  Surgeon: Thornell Sartorius, MD;  Location: [facility];  Service: Gynecology;  Laterality: N/A;  thru incision (laparotomy)/Myomyectomy  . WISDOM TOOTH EXTRACTION     Family History  Problem Relation Age of Onset  . Diabetes Other        uncle  . Hypertension Mother        M, aunt, GM  . Leukemia  Other        cousin  . Breast cancer Other        aunt  . Cancer Maternal Aunt        Lung Cancer  . Cancer Maternal Uncle        Lung Cancer  . Colon polyps Maternal Uncle   . CAD Maternal Grandmother   . Colon cancer Neg Hx   . Allergic rhinitis Neg Hx   . Angioedema Neg Hx   . Asthma Neg Hx   . Atopy Neg Hx   . Eczema Neg Hx   . Immunodeficiency Neg Hx   . Urticaria Neg Hx    Social History   Tobacco Use  . Smoking status: Never Smoker  . Smokeless tobacco: Never Used  Substance Use Topics  . Alcohol use: Yes    Comment: socially  . Drug use: No   Current Outpatient Medications  Medication Sig Dispense  Refill  . amLODipine (NORVASC) 10 MG tablet Take 0.5 tablets (5 mg total) by mouth daily. 45 tablet 3  . carvedilol (COREG) 6.25 MG tablet TAKE 1 TABLET (6.25 MG TOTAL) BY MOUTH 2 (TWO) TIMES DAILY WITH A MEAL. 180 tablet 3  . clindamycin (CLEOCIN T) 1 % lotion Apply topically 2 (two) times daily.    Marland Kitchen FERREX 150 150 MG capsule Take 150 mg by mouth daily.    Marland Kitchen Fexofenadine HCl (ALLEGRA PO) Take by mouth daily as needed.    . Polyethylene Glycol 3350 (MIRALAX PO) Take 17 g by mouth daily.     No current facility-administered medications for this visit.   No Known Allergies   Review of Systems: Positive for arthritis, sinus trouble, menstrual pain  All other systems reviewed and negative except where noted in HPI.   Creatinine clearance cannot be calculated (Patient's most recent lab result is older than the maximum 21 days allowed.)   Physical Exam:    Wt Readings from Last 3 Encounters:  01/11/20 194 lb (88 kg)  12/12/19 197 lb (89.4 kg)  12/06/19 196 lb (88.9 kg)    BP 128/88   Pulse 64   Temp 97.9 F (36.6 C)   Ht 5\' 6"  (1.676 m)   Wt 194 lb (88 kg)   BMI 31.31 kg/m  Constitutional:  Pleasant female in no acute distress. Psychiatric: Normal mood and affect. Behavior is normal. EENT: Pupils normal.  Conjunctivae are normal. No scleral icterus. Neck supple.  Cardiovascular: Normal rate, regular rhythm. No edema Pulmonary/chest: Effort normal and breath sounds normal. No wheezing, rales or rhonchi. Abdominal: Soft, nondistended, nontender. Bowel sounds active throughout. There are no masses palpable. Firm masses in mid lower abdomen ( fibroids) Neurological: Alert and oriented to person place and time. Skin: Skin is warm and dry. No rashes noted.  Tye Savoy, NP  01/11/2020, 3:41 PM  Cc:  Referring Provider Colon Branch, MD

## 2020-01-14 ENCOUNTER — Other Ambulatory Visit: Payer: Self-pay | Admitting: Internal Medicine

## 2020-01-17 NOTE — Progress Notes (Signed)
Reviewed and agree with documentation and assessment and plan. K. Veena Althia Egolf , MD   

## 2020-02-02 ENCOUNTER — Ambulatory Visit (AMBULATORY_SURGERY_CENTER): Payer: Self-pay | Admitting: *Deleted

## 2020-02-02 ENCOUNTER — Encounter: Payer: Self-pay | Admitting: Internal Medicine

## 2020-02-02 ENCOUNTER — Other Ambulatory Visit: Payer: Self-pay

## 2020-02-02 VITALS — Temp 96.9°F | Ht 66.0 in | Wt 196.0 lb

## 2020-02-02 DIAGNOSIS — Z1211 Encounter for screening for malignant neoplasm of colon: Secondary | ICD-10-CM

## 2020-02-02 MED ORDER — SUTAB 1479-225-188 MG PO TABS: 24.0000 | ORAL_TABLET | ORAL | 0 refills | Status: DC

## 2020-02-02 NOTE — Progress Notes (Signed)
01-08-2020 pt completed covid vaccines   No egg or soy allergy known to patient  No issues with past sedation with any surgeries  or procedures, no intubation problems  No diet pills per patient No home 02 use per patient  No blood thinners per patient  Pt denies issues with constipation  No A fib or A flutter  EMMI video sent to pt's e mail   Due to the COVID-19 pandemic we are asking patients to follow these guidelines. Please only bring one care partner. Please be aware that your care partner may wait in the car in the parking lot or if they feel like they will be too hot to wait in the car, they may wait in the lobby on the 4th floor. All care partners are required to wear a mask the entire time (we do not have any that we can provide them), they need to practice social distancing, and we will do a Covid check for all patient's and care partners when you arrive. Also we will check their temperature and your temperature. If the care partner waits in their car they need to stay in the parking lot the entire time and we will call them on their cell phone when the patient is ready for discharge so they can bring the car to the front of the building. Also all patient's will need to wear a mask into building.

## 2020-02-10 ENCOUNTER — Encounter: Payer: Self-pay | Admitting: Gastroenterology

## 2020-02-16 ENCOUNTER — Other Ambulatory Visit: Payer: Self-pay

## 2020-02-16 ENCOUNTER — Ambulatory Visit (AMBULATORY_SURGERY_CENTER): Payer: Managed Care, Other (non HMO) | Admitting: Gastroenterology

## 2020-02-16 ENCOUNTER — Encounter: Payer: Self-pay | Admitting: Gastroenterology

## 2020-02-16 VITALS — BP 110/65 | HR 73 | Temp 97.3°F | Resp 17 | Ht 66.0 in | Wt 196.0 lb

## 2020-02-16 DIAGNOSIS — K635 Polyp of colon: Secondary | ICD-10-CM

## 2020-02-16 DIAGNOSIS — Z1211 Encounter for screening for malignant neoplasm of colon: Secondary | ICD-10-CM | POA: Diagnosis present

## 2020-02-16 DIAGNOSIS — D122 Benign neoplasm of ascending colon: Secondary | ICD-10-CM

## 2020-02-16 MED ORDER — HYDROCORTISONE (PERIANAL) 2.5 % EX CREA
1.0000 | TOPICAL_CREAM | Freq: Two times a day (BID) | CUTANEOUS | 1 refills | Status: DC | PRN
Start: 2020-02-16 — End: 2020-12-04

## 2020-02-16 MED ORDER — SODIUM CHLORIDE 0.9 % IV SOLN: 500.0000 mL | Freq: Once | INTRAVENOUS | Status: DC

## 2020-02-16 NOTE — Patient Instructions (Signed)
HANDOUTS PROVIDED ON: POLYPS & HEMORRHOIDS  The polyp removed today have been sent for pathology.  The results can take 1-3 weeks to receive.  When your next colonoscopy should occur will be based on the pathology results.    You may resume your previous diet and medication schedule.  Thank you for allowing us to care for you today!!!   YOU HAD AN ENDOSCOPIC PROCEDURE TODAY AT THE [facility]:   Refer to the procedure report that was given to you for any specific questions about what was found during the examination.  If the procedure report does not answer your questions, please call your gastroenterologist to clarify.  If you requested that your care partner not be given the details of your procedure findings, then the procedure report has been included in a sealed envelope for you to review at your convenience later.  YOU SHOULD EXPECT: Some feelings of bloating in the abdomen. Passage of more gas than usual.  Walking can help get rid of the air that was put into your GI tract during the procedure and reduce the bloating. If you had a lower endoscopy (such as a colonoscopy or flexible sigmoidoscopy) you may notice spotting of blood in your stool or on the toilet paper. If you underwent a bowel prep for your procedure, you may not have a normal bowel movement for a few days.  Please Note:  You might notice some irritation and congestion in your nose or some drainage.  This is from the oxygen used during your procedure.  There is no need for concern and it should clear up in a day or so.  SYMPTOMS TO REPORT IMMEDIATELY:   Following lower endoscopy (colonoscopy or flexible sigmoidoscopy):  Excessive amounts of blood in the stool  Significant tenderness or worsening of abdominal pains  Swelling of the abdomen that is new, acute  Fever of 100F or higher  For urgent or emergent issues, a gastroenterologist can be reached at any hour by calling [phone]. Do not use MyChart  messaging for urgent concerns.    DIET:  We do recommend a small meal at first, but then you may proceed to your regular diet.  Drink plenty of fluids but you should avoid alcoholic beverages for 24 hours.  ACTIVITY:  You should plan to take it easy for the rest of today and you should NOT DRIVE or use heavy machinery until tomorrow (because of the sedation medicines used during the test).    FOLLOW UP: Our staff will call the number listed on your records 48-72 hours following your procedure to check on you and address any questions or concerns that you may have regarding the information given to you following your procedure. If we do not reach you, we will leave a message.  We will attempt to reach you two times.  During this call, we will ask if you have developed any symptoms of COVID 19. If you develop any symptoms (ie: fever, flu-like symptoms, shortness of breath, cough etc.) before then, please call [phone].  If you test positive for Covid 19 in the 2 weeks post procedure, please call and report this information to us.    If any biopsies were taken you will be contacted by phone or by letter within the next 1-3 weeks.  Please call us at [phone] if you have not heard about the biopsies in 3 weeks.    SIGNATURES/CONFIDENTIALITY: You and/or your care partner have signed paperwork which will be   be entered into your electronic medical record.  These signatures attest to the fact that that the information above on your After Visit Summary has been reviewed and is understood.  Full responsibility of the confidentiality of this discharge information lies with you and/or your care-partner. 

## 2020-02-16 NOTE — Progress Notes (Signed)
Called to room to assist during endoscopic procedure.  Patient ID and intended procedure confirmed with present staff. Received instructions for my participation in the procedure from the performing physician.  

## 2020-02-16 NOTE — Op Note (Signed)
[facility] Patient Name: Miranda [name] Procedure Date: 02/16/2020 8:37 AM MRN: [id number] Endoscopist: Mauri Pole , MD Age: 44 Referring MD:  Date of Birth: 06-21-1976 Gender: Female Account #: [id number] Procedure:                Colonoscopy Indications:              Screening for colorectal malignant neoplasm Medicines:                Monitored Anesthesia Care Procedure:                Pre-Anesthesia Assessment:                           - Prior to the procedure, a History and Physical                            was performed, and patient medications and                            allergies were reviewed. The patient's tolerance of                            previous anesthesia was also reviewed. The risks                            and benefits of the procedure and the sedation                            options and risks were discussed with the patient.                            All questions were answered, and informed consent                            was obtained. Prior Anticoagulants: The patient has                            taken no previous anticoagulant or antiplatelet                            agents. ASA Grade Assessment: II - A patient with                            mild systemic disease. After reviewing the risks                            and benefits, the patient was deemed in                            satisfactory condition to undergo the procedure.                           After obtaining informed consent, the colonoscope  was passed under direct vision. Throughout the                            procedure, the patient's blood pressure, pulse, and                            oxygen saturations were monitored continuously. The                            Colonoscope was introduced through the anus and                            advanced to the the cecum, identified by                            appendiceal orifice and  ileocecal valve. The                            colonoscopy was performed without difficulty. The                            patient tolerated the procedure well. The quality                            of the bowel preparation was good. The ileocecal                            valve, appendiceal orifice, and rectum were                            photographed. Scope In: 8:47:43 AM Scope Out: 9:07:53 AM Scope Withdrawal Time: 0 hours 12 minutes 40 seconds  Total Procedure Duration: 0 hours 20 minutes 10 seconds  Findings:                 The perianal exam findings include internal                            hemorrhoids that prolapse with straining, but                            require manual replacement into the anal canal                            (Grade III). Partial rectal mucosa prolapse.                           A 5 mm polyp was found in the ascending colon. The                            polyp was sessile. The polyp was removed with a                            cold snare. Resection and retrieval were complete.  External and internal hemorrhoids were found during                            retroflexion and during perianal exam. The                            hemorrhoids were large and Grade III (internal                            hemorrhoids that prolapse but require manual                            reduction). Complications:            No immediate complications. Estimated Blood Loss:     Estimated blood loss was minimal. Impression:               - Internal hemorrhoids that prolapse with                            straining, but require manual replacement into the                            anal canal (Grade III) found on perianal exam.                           - One 5 mm polyp in the ascending colon, removed                            with a cold snare. Resected and retrieved.                           - External and internal  hemorrhoids. Recommendation:           - Patient has a contact number available for                            emergencies. The signs and symptoms of potential                            delayed complications were discussed with the                            patient. Return to normal activities tomorrow.                            Written discharge instructions were provided to the                            patient.                           - Resume previous diet.                           - Continue present medications.                           -  Await pathology results.                           - Repeat colonoscopy in 5-10 years for surveillance                            based on pathology results. Mauri Pole, MD 02/16/2020 9:15:15 AM This report has been signed electronically.

## 2020-02-16 NOTE — Progress Notes (Signed)
Temp JB VS DT  Pt's states no medical or surgical changes since previsit or office visit. 

## 2020-02-16 NOTE — Progress Notes (Signed)
Report given to PACU, vss 

## 2020-02-20 ENCOUNTER — Telehealth: Payer: Self-pay | Admitting: *Deleted

## 2020-02-20 NOTE — Telephone Encounter (Signed)
  Follow up Call-  Call back number 02/16/2020  Post procedure Call Back phone  # [phone]  Permission to leave phone message Yes  Some recent data might be hidden     Patient questions:  Do you have a fever, pain , or abdominal swelling? No. Pain Score  0 *  Have you tolerated food without any problems? Yes.    Have you been able to return to your normal activities? Yes.    Do you have any questions about your discharge instructions: Diet   No. Medications  No. Follow up visit  No.  Do you have questions or concerns about your Care? No.  Actions: * If pain score is 4 or above: No action needed, pain <4  1. Have you developed a fever since your procedure? NO  2.   Have you had an respiratory symptoms (SOB or cough) since your procedure? NO  3.   Have you tested positive for COVID 19 since your procedure NO  4.   Have you had any family members/close contacts diagnosed with the COVID 19 since your procedure?  NO   If yes to any of these questions please route to Joylene John, RN and Erenest Rasher, RN

## 2020-03-01 ENCOUNTER — Encounter: Payer: Self-pay | Admitting: Gastroenterology

## 2020-04-10 ENCOUNTER — Other Ambulatory Visit: Payer: Self-pay

## 2020-04-10 ENCOUNTER — Ambulatory Visit (INDEPENDENT_AMBULATORY_CARE_PROVIDER_SITE_OTHER): Payer: Managed Care, Other (non HMO) | Admitting: Internal Medicine

## 2020-04-10 ENCOUNTER — Encounter: Payer: Self-pay | Admitting: Internal Medicine

## 2020-04-10 VITALS — BP 126/84 | HR 74 | Temp 98.2°F | Resp 18 | Ht 66.0 in | Wt 199.5 lb

## 2020-04-10 DIAGNOSIS — I10 Essential (primary) hypertension: Secondary | ICD-10-CM

## 2020-04-10 DIAGNOSIS — N924 Excessive bleeding in the premenopausal period: Secondary | ICD-10-CM

## 2020-04-10 DIAGNOSIS — R55 Syncope and collapse: Secondary | ICD-10-CM

## 2020-04-10 MED ORDER — TRIAMTERENE-HCTZ 37.5-25 MG PO TABS: 1.0000 | ORAL_TABLET | Freq: Every day | ORAL | 0 refills | Status: DC

## 2020-04-10 NOTE — Patient Instructions (Signed)
Stop amlodipine, that may be causing the swelling  Start Maxide, a water pill, once a morning.  Continue carvedilol  Check the  blood pressure 2 or 3 times a week  BP GOAL is between 110/65 and  135/85. If it is consistently higher or lower, let me know   [place] back for blood work in 10 days  Come back for a checkup in 3 months

## 2020-04-10 NOTE — Progress Notes (Signed)
Pre visit review using our clinic review tool, if applicable. No additional management support is needed unless otherwise documented below in the visit note. 

## 2020-04-10 NOTE — Progress Notes (Addendum)
Subjective:    Patient ID: Miranda [name], female    DOB: 03/23/1976, 44 y.o.   MRN: [id number]  DOS:  04/10/2020 Type of visit - description: Acute Her main concern today is 2 months history of right ankle and foot swelling.  No pretibial edema. The foot or ankle are not red, warm or TTP.  Also, 2 months ago she "passed out". At the time she was having a very heavy period associated w/ painful cramps. She felt she was about to pass out, laid down in bed and apparently lost consciousness. No seizure activity, she woke up later and she felt okay. At the time did not have chest pain, headaches or palpitations. No chest pain no difficulty breathing. No further episodes.    Wt Readings from Last 3 Encounters:  04/10/20 199 lb 8 oz (90.5 kg)  02/16/20 196 lb (88.9 kg)  02/02/20 196 lb (88.9 kg)   BP Readings from Last 3 Encounters:  04/10/20 126/84  02/16/20 110/65  01/11/20 128/88     Review of Systems See above   Past Medical History:  Diagnosis Date  . Acne   . Allergic rhinitis   . Allergy   . Anemia   . Fibroids, intramural 10/22/2011  . GERD (gastroesophageal reflux disease)    past hx- occ prn Tums   . HA (headache) 07/2008   MRI (-), MRA 2 anerurysms?------> CT angio (-); saw Neuro, likely migraines, Rx imitrex  . Hemorrhoids   . Hypertension     Past Surgical History:  Procedure Laterality Date  . LAPAROTOMY  10/23/2011   Procedure: EXPLORATORY LAPAROTOMY;  Surgeon: Thornell Sartorius, MD;  Location: [facility];  Service: Gynecology;  Laterality: N/A;  thru incision (laparotomy)/Myomyectomy  . WISDOM TOOTH EXTRACTION      Allergies as of 04/10/2020   No Known Allergies     Medication List       Accurate as of April 10, 2020 11:59 PM. If you have any questions, ask your nurse or doctor.        STOP taking these medications   amLODipine 10 MG tablet Commonly known as: NORVASC Stopped by: Kathlene November, MD     TAKE these medications   ALLEGRA PO Take by mouth  daily as needed.   carvedilol 6.25 MG tablet Commonly known as: COREG TAKE 1 TABLET (6.25 MG TOTAL) BY MOUTH 2 (TWO) TIMES DAILY WITH A MEAL.   clindamycin 1 % lotion Commonly known as: CLEOCIN T Apply topically 2 (two) times daily.   Ferrex 150 150 MG capsule Generic drug: iron polysaccharides Take 150 mg by mouth daily.   hydrocortisone 2.5 % rectal cream Commonly known as: ANUSOL-HC Place 1 application rectally 2 (two) times daily as needed for hemorrhoids or anal itching.   MIRALAX PO Take 17 g by mouth daily.   triamterene-hydrochlorothiazide 37.5-25 MG tablet Commonly known as: MAXZIDE-25 Take 1 tablet by mouth daily. Started by: Kathlene November, MD          Objective:   Physical Exam BP 126/84 (BP Location: Left Arm, Patient Position: Sitting, Cuff Size: Normal)   Pulse 74   Temp 98.2 F (36.8 C) (Oral)   Resp 18   Ht 5\' 6"  (1.676 m)   Wt 199 lb 8 oz (90.5 kg)   LMP 04/02/2020 (Exact Date)   SpO2 100%   BMI 32.20 kg/m  General:   Well developed, NAD, BMI noted. HEENT:  Normocephalic . Face symmetric, atraumatic Lungs:  CTA B Normal  respiratory effort, no intercostal retractions, no accessory muscle use. Heart: RRR,  no murmur.  Lower extremities: no pretibial edema but has very ankle foot edema on the right  (+/+++) and on the left  (trace) skin: Not pale. Not jaundice Neurologic:  alert & oriented X3.  Speech normal, gait appropriate for age and unassisted Psych--  Cognition and judgment appear intact.  Cooperative with normal attention span and concentration.  Behavior appropriate. No anxious or depressed appearing.      Assessment     Assessment   HTN Acne Headaches --- MRI (-), MRA 2 anerurysms?------> CT angio (-); saw Neuro, likely migraines, Rx imitrex Syncope-- 01-2014, 02-2016 (likely vaso-vagal)  Fibroid tumors, not on BCP Integrative therapies: they Rx armour thyroid and testosterone   PLAN: Foot and ankle edema, bilateral, worse on  the right.  Is possibly due to amlodipine, she would like to do something about it.  Will stop amlodipine, see next HTN: Stop amlodipine due to edema, continue carvedilol. We will start on Maxide 1 tablet daily (noting that she took it before for a while and only one time had low potassium). Check ambulatory BPs regularly and BMP in 10 days. Syncope: In the context of heavy menstrual bleeding and cramping, possibly vasovagal, had similar episodes before.  Observation. Heavy menses: Check a CBC, discussed with gynecology?  She was offered surgery before and she does not like to proceed.  Continue iron. RTC labs 10 days RTC checkup 3 months  This visit occurred during the SARS-CoV-2 public health emergency.  Safety protocols were in place, including screening questions prior to the visit, additional usage of staff PPE, and extensive cleaning of exam room while observing appropriate contact time as indicated for disinfecting solutions.

## 2020-04-12 NOTE — Assessment & Plan Note (Signed)
Foot and ankle edema, bilateral, worse on the right.  Is possibly due to amlodipine, she would like to do something about it.  Will stop amlodipine, see next HTN: Stop amlodipine due to edema, continue carvedilol. We will start on Maxide 1 tablet daily (noting that she took it before for a while and only one time had low potassium). Check ambulatory BPs regularly and BMP in 10 days. Syncope: In the context of heavy menstrual bleeding and cramping, possibly vasovagal, had similar episodes before.  Observation. Heavy menses: Check a CBC, discussed with gynecology?  She was offered surgery before and she does not like to proceed.  Continue iron. RTC labs 10 days RTC checkup 3 months

## 2020-04-23 ENCOUNTER — Other Ambulatory Visit (INDEPENDENT_AMBULATORY_CARE_PROVIDER_SITE_OTHER): Payer: Managed Care, Other (non HMO)

## 2020-04-23 ENCOUNTER — Other Ambulatory Visit: Payer: Self-pay

## 2020-04-23 DIAGNOSIS — I10 Essential (primary) hypertension: Secondary | ICD-10-CM | POA: Diagnosis not present

## 2020-04-24 LAB — CBC WITH DIFFERENTIAL/PLATELET
Basophils Absolute: 0.2 10*3/uL — ABNORMAL HIGH (ref 0.0–0.1)
Basophils Relative: 3 % (ref 0.0–3.0)
Eosinophils Absolute: 0.1 10*3/uL (ref 0.0–0.7)
Eosinophils Relative: 1.3 % (ref 0.0–5.0)
HCT: 29.9 % — ABNORMAL LOW (ref 36.0–46.0)
Hemoglobin: 9.2 g/dL — ABNORMAL LOW (ref 12.0–15.0)
Lymphocytes Relative: 37.8 % (ref 12.0–46.0)
Lymphs Abs: 1.9 10*3/uL (ref 0.7–4.0)
MCHC: 30.9 g/dL (ref 30.0–36.0)
MCV: 73.8 fl — ABNORMAL LOW (ref 78.0–100.0)
Monocytes Absolute: 0.5 10*3/uL (ref 0.1–1.0)
Monocytes Relative: 10.3 % (ref 3.0–12.0)
Neutro Abs: 2.4 10*3/uL (ref 1.4–7.7)
Neutrophils Relative %: 47.6 % (ref 43.0–77.0)
Platelets: 413 10*3/uL — ABNORMAL HIGH (ref 150.0–400.0)
RBC: 4.06 Mil/uL (ref 3.87–5.11)
RDW: 19.2 % — ABNORMAL HIGH (ref 11.5–15.5)
WBC: 4.9 10*3/uL (ref 4.0–10.5)

## 2020-04-24 LAB — BASIC METABOLIC PANEL
BUN: 17 mg/dL (ref 6–23)
CO2: 23 mEq/L (ref 19–32)
Calcium: 9.4 mg/dL (ref 8.4–10.5)
Chloride: 102 mEq/L (ref 96–112)
Creatinine, Ser: 0.84 mg/dL (ref 0.40–1.20)
GFR: 88.17 mL/min (ref >60.00)
Glucose, Bld: 74 mg/dL (ref 70–99)
Potassium: 3.5 mEq/L (ref 3.5–5.1)
Sodium: 134 mEq/L — ABNORMAL LOW (ref 135–145)

## 2020-04-26 MED ORDER — IRON 325 (65 FE) MG PO TABS: 1.0000 | ORAL_TABLET | Freq: Two times a day (BID) | ORAL | 6 refills | Status: DC

## 2020-04-26 NOTE — Addendum Note (Signed)
Addended byDamita Dunnings D on: 04/26/2020 07:56 AM   Modules accepted: Orders

## 2020-05-01 ENCOUNTER — Telehealth: Payer: Self-pay | Admitting: Internal Medicine

## 2020-05-01 NOTE — Telephone Encounter (Signed)
Call the patient, recently started on Maxide, she is due for a BMP. Please arrange. Also ask about blood pressure, controlled?

## 2020-05-02 ENCOUNTER — Other Ambulatory Visit: Payer: Self-pay | Admitting: Internal Medicine

## 2020-05-02 NOTE — Telephone Encounter (Signed)
Pt had blood work on 04/23/2020 (BMP). Will reach out to see how her BPs have been doing.

## 2020-05-02 NOTE — Telephone Encounter (Signed)
Last read by Burnis Medin at 9:32 AM on 05/02/2020

## 2020-05-02 NOTE — Telephone Encounter (Signed)
Correct, BMP was drawn, we just need to know about her blood pressure.

## 2020-05-14 MED ORDER — CARVEDILOL 12.5 MG PO TABS
12.5000 mg | ORAL_TABLET | Freq: Two times a day (BID) | ORAL | 6 refills | Status: DC
Start: 2020-05-14 — End: 2020-10-26

## 2020-05-17 ENCOUNTER — Encounter: Payer: Self-pay | Admitting: Internal Medicine

## 2020-05-17 ENCOUNTER — Ambulatory Visit: Payer: Managed Care, Other (non HMO) | Admitting: Internal Medicine

## 2020-05-17 ENCOUNTER — Other Ambulatory Visit: Payer: Self-pay

## 2020-05-17 ENCOUNTER — Ambulatory Visit (HOSPITAL_BASED_OUTPATIENT_CLINIC_OR_DEPARTMENT_OTHER)
Admission: RE | Admit: 2020-05-17 | Discharge: 2020-05-17 | Disposition: A | Discharge status: Home / Self Care | Payer: Managed Care, Other (non HMO) | Source: Ambulatory Visit | Attending: Internal Medicine | Admitting: Internal Medicine

## 2020-05-17 VITALS — BP 114/72 | HR 74 | Temp 98.1°F | Resp 16 | Ht 66.0 in | Wt 200.2 lb

## 2020-05-17 DIAGNOSIS — M79671 Pain in right foot: Secondary | ICD-10-CM

## 2020-05-17 DIAGNOSIS — L80 Vitiligo: Secondary | ICD-10-CM | POA: Insufficient documentation

## 2020-05-17 DIAGNOSIS — L669 Cicatricial alopecia, unspecified: Secondary | ICD-10-CM | POA: Insufficient documentation

## 2020-05-17 NOTE — Progress Notes (Signed)
Pre visit review using our clinic review tool, if applicable. No additional management support is needed unless otherwise documented below in the visit note. 

## 2020-05-17 NOTE — Patient Instructions (Signed)
  STOP BY THE FIRST FLOOR:  get the XR   Tylenol  500 mg OTC 2 tabs a day every 8 hours as needed for pain  IBUPROFEN (Advil or Motrin) 200 mg 2 tablets every 8 hours as needed for pain.  Always take it with food because may cause gastritis and ulcers.  If you notice nausea, stomach pain, change in the color of stools --->  Stop the medicine and let us know    RICE Therapy for Routine Care of Injuries Many injuries can be cared for with rest, ice, compression, and elevation (RICE therapy). This includes:  Resting the injured part.  Putting ice on the injury.  Putting pressure (compression) on the injury.  Raising the injured part (elevation). Using RICE therapy can help to lessen pain and swelling. Supplies needed:  Ice.  Plastic bag.  Towel.  Elastic bandage.  Pillow or pillows to raise (elevate) your injured body part. How to care for your injury with RICE therapy Rest Limit your normal activities, and try not to use the injured part of your body. You can go back to your normal activities when your doctor says it is okay to do them and you feel okay. Ask your doctor if you should do exercises to help your injury get better. Ice Put ice on the injured area. Do not put ice on your bare skin.  Put ice in a plastic bag.  Place a towel between your skin and the bag.  Leave the ice on for 20 minutes, 2-3 times a day. Use ice on as many days as told by your doctor.  Compression Compression means putting pressure on the injured area. This can be done with an elastic bandage. If an elastic bandage has been put on your injury:  Do not wrap the bandage too tight. Wrap the bandage more loosely if part of your body away from the bandage is blue, swollen, cold, painful, or loses feeling (gets numb).  Take off the bandage and put it on again. Do this every 3-4 hours or as told by your doctor.  See your doctor if the bandage seems to make your problems  worse.  Elevation Elevation means keeping the injured area raised. If you can, raise the injured area above your heart or the center of your chest. Contact a doctor if:  You keep having pain and swelling.  Your symptoms get worse. Get help right away if:  You have sudden bad pain at your injury or lower than your injury.  You have redness or more swelling around your injury.  You have tingling or numbness at your injury or lower than your injury, and it does not go away when you take off the bandage. Summary  Many injuries can be cared for using rest, ice, compression, and elevation (RICE therapy).  You can go back to your normal activities when you feel okay and your doctor says it is okay.  Put ice on the injured area as told by your doctor.  Get help if your symptoms get worse or if you keep having pain and swelling. This information is not intended to replace advice given to you by your health care provider. Make sure you discuss any questions you have with your health care provider. Document Revised: 06/05/2017 Document Reviewed: 06/05/2017 Elsevier Patient Education  [place].

## 2020-05-17 NOTE — Progress Notes (Signed)
Subjective:    Patient ID: Miranda [name], female    DOB: 1976/06/16, 44 y.o.   MRN: [id number]  DOS:  05/17/2020 Type of visit - description: Acute 2 days ago developed pain at the right foot. Mild swelling, no injury but she has been doing more walking at work lately (last week).   Review of Systems No warmness that she can tell. No calf pain or swelling  Past Medical History:  Diagnosis Date  . Acne   . Allergic rhinitis   . Allergy   . Anemia   . Fibroids, intramural 10/22/2011  . GERD (gastroesophageal reflux disease)    past hx- occ prn Tums   . HA (headache) 07/2008   MRI (-), MRA 2 anerurysms?------> CT angio (-); saw Neuro, likely migraines, Rx imitrex  . Hemorrhoids   . Hypertension     Past Surgical History:  Procedure Laterality Date  . LAPAROTOMY  10/23/2011   Procedure: EXPLORATORY LAPAROTOMY;  Surgeon: Thornell Sartorius, MD;  Location: [facility];  Service: Gynecology;  Laterality: N/A;  thru incision (laparotomy)/Myomyectomy  . WISDOM TOOTH EXTRACTION      Allergies as of 05/17/2020   No Known Allergies     Medication List       Accurate as of May 17, 2020 11:59 PM. If you have any questions, ask your nurse or doctor.        STOP taking these medications   Ferrex 150 150 MG capsule Generic drug: iron polysaccharides Stopped by: Kathlene November, MD     TAKE these medications   ALLEGRA PO Take by mouth daily as needed.   carvedilol 12.5 MG tablet Commonly known as: COREG Take 1 tablet (12.5 mg total) by mouth 2 (two) times daily with a meal.   clindamycin 1 % lotion Commonly known as: CLEOCIN T Apply topically 2 (two) times daily.   hydrocortisone 2.5 % rectal cream Commonly known as: ANUSOL-HC Place 1 application rectally 2 (two) times daily as needed for hemorrhoids or anal itching.   Iron 325 (65 Fe) MG Tabs Take 1 tablet (325 mg total) by mouth 2 (two) times daily before a meal.   MIRALAX PO Take 17 g by mouth daily.     triamterene-hydrochlorothiazide 37.5-25 MG tablet Commonly known as: MAXZIDE-25 TAKE 1 TABLET BY MOUTH EVERY DAY          Objective:   Physical Exam Musculoskeletal:       Feet:    BP 114/72 (BP Location: Left Arm, Patient Position: Sitting, Cuff Size: Normal)   Pulse 74   Temp 98.1 F (36.7 C) (Oral)   Resp 16   Ht 5\' 6"  (1.676 m)   Wt 200 lb 4 oz (90.8 kg)   LMP 04/21/2020 (Approximate)   SpO2 99%   BMI 32.32 kg/m  General:   Well developed, NAD, BMI noted. HEENT:  Normocephalic . Face symmetric, atraumatic MSK: Left ankle and foot normal. Right ankle and foot: See graphic. Skin: Not pale. Not jaundice Neurologic:  alert & oriented X3.  Speech normal, gait appropriate for age and unassisted Psych--  Cognition and judgment appear intact.  Cooperative with normal attention span and concentration.  Behavior appropriate. No anxious or depressed appearing.      Assessment    Assessment   HTN Acne Headaches --- MRI (-), MRA 2 anerurysms?------> CT angio (-); saw Neuro, likely migraines, Rx imitrex Syncope-- 01-2014, 02-2016 (likely vaso-vagal)  Fibroid tumors, not on BCP Integrative therapies: they Rx armour thyroid and  testosterone   PLAN: Right foot pain: As described above, mild puffiness noted, slightly TTP, not red or warm. Stress fracture?  She has been doing more walking at work in the last 2 weeks. Plan: X-ray, Tylenol, ibuprofen okay but limit its use, GI precautions discussed. RICE.  Fortunately she does not have to do a lot of walking at work for the next 3 days. Call if no better HTN: At the last visit, amlodipine was stopped and started Maxide, follow-up BMP satisfactory.  BP today is very good.  Edema resolved. Addendum: Also request check iron and vitamin D.  Will arrange.  This visit occurred during the SARS-CoV-2 public health emergency.  Safety protocols were in place, including screening questions prior to the visit, additional usage of  staff PPE, and extensive cleaning of exam room while observing appropriate contact time as indicated for disinfecting solutions.

## 2020-05-18 ENCOUNTER — Other Ambulatory Visit: Payer: Self-pay

## 2020-05-18 DIAGNOSIS — D649 Anemia, unspecified: Secondary | ICD-10-CM

## 2020-05-18 DIAGNOSIS — T452X1A Poisoning by vitamins, accidental (unintentional), initial encounter: Secondary | ICD-10-CM

## 2020-05-18 NOTE — Assessment & Plan Note (Signed)
Right foot pain: As described above, mild puffiness noted, slightly TTP, not red or warm. Stress fracture?  She has been doing more walking at work in the last 2 weeks. Plan: X-ray, Tylenol, ibuprofen okay but limit its use, GI precautions discussed. RICE.  Fortunately she does not have to do a lot of walking at work for the next 3 days. Call if no better HTN: At the last visit, amlodipine was stopped and started Maxide, follow-up BMP satisfactory.  BP today is very good.  Edema resolved. Addendum: Also request check iron and vitamin D.  Will arrange.

## 2020-05-25 ENCOUNTER — Other Ambulatory Visit (INDEPENDENT_AMBULATORY_CARE_PROVIDER_SITE_OTHER): Payer: Managed Care, Other (non HMO)

## 2020-05-25 DIAGNOSIS — T452X1A Poisoning by vitamins, accidental (unintentional), initial encounter: Secondary | ICD-10-CM

## 2020-05-25 DIAGNOSIS — D649 Anemia, unspecified: Secondary | ICD-10-CM

## 2020-05-25 LAB — HEMATOCRIT: HCT: 37.5 % (ref 36.0–46.0)

## 2020-05-25 LAB — HEMOGLOBIN: Hemoglobin: 11.8 g/dL — ABNORMAL LOW (ref 12.0–15.0)

## 2020-05-25 LAB — IBC + FERRITIN
Ferritin: 17.3 ng/mL (ref 10.0–291.0)
Iron: 106 ug/dL (ref 42–145)
Saturation Ratios: 22.5 % (ref 20.0–50.0)
Transferrin: 337 mg/dL (ref 212.0–360.0)

## 2020-05-25 LAB — VITAMIN D 25 HYDROXY (VIT D DEFICIENCY, FRACTURES): VITD: 26.39 ng/mL — ABNORMAL LOW (ref 30.00–100.00)

## 2020-05-27 ENCOUNTER — Other Ambulatory Visit: Payer: Self-pay | Admitting: Internal Medicine

## 2020-05-28 MED ORDER — VITAMIN D (ERGOCALCIFEROL) 1.25 MG (50000 UNIT) PO CAPS: 50000.0000 [IU] | ORAL_CAPSULE | ORAL | 0 refills | Status: DC

## 2020-07-18 ENCOUNTER — Encounter: Payer: Self-pay | Admitting: Internal Medicine

## 2020-07-18 ENCOUNTER — Ambulatory Visit: Payer: Managed Care, Other (non HMO) | Admitting: Internal Medicine

## 2020-07-18 ENCOUNTER — Other Ambulatory Visit: Payer: Self-pay

## 2020-07-18 VITALS — BP 117/82 | HR 85 | Temp 98.5°F | Resp 18 | Ht 66.0 in | Wt 199.8 lb

## 2020-07-18 DIAGNOSIS — R1030 Lower abdominal pain, unspecified: Secondary | ICD-10-CM | POA: Diagnosis not present

## 2020-07-18 DIAGNOSIS — D649 Anemia, unspecified: Secondary | ICD-10-CM

## 2020-07-18 DIAGNOSIS — I10 Essential (primary) hypertension: Secondary | ICD-10-CM | POA: Diagnosis not present

## 2020-07-18 NOTE — Progress Notes (Signed)
Subjective:    Patient ID: Miranda [name], female    DOB: 08-24-1976, 44 y.o.   MRN: [id number]  DOS:  07/18/2020 Type of visit - description: Routine checkup Since the last visit, has seen gynecology regards fibroid tumors HTN: Good med compliance, ambulatory BP normal. Lower abdominal pain decreased.   Review of Systems Reports no other concerns  Past Medical History:  Diagnosis Date  . Acne   . Allergic rhinitis   . Allergy   . Anemia   . Fibroids, intramural 10/22/2011  . GERD (gastroesophageal reflux disease)    past hx- occ prn Tums   . HA (headache) 07/2008   MRI (-), MRA 2 anerurysms?------> CT angio (-); saw Neuro, likely migraines, Rx imitrex  . Hemorrhoids   . Hypertension     Past Surgical History:  Procedure Laterality Date  . LAPAROTOMY  10/23/2011   Procedure: EXPLORATORY LAPAROTOMY;  Surgeon: Thornell Sartorius, MD;  Location: [facility];  Service: Gynecology;  Laterality: N/A;  thru incision (laparotomy)/Myomyectomy  . WISDOM TOOTH EXTRACTION      Allergies as of 07/18/2020   No Known Allergies     Medication List       Accurate as of July 18, 2020 11:59 PM. If you have any questions, ask your nurse or doctor.        ALLEGRA PO Take by mouth daily as needed.   carvedilol 12.5 MG tablet Commonly known as: COREG Take 1 tablet (12.5 mg total) by mouth 2 (two) times daily with a meal.   clindamycin 1 % lotion Commonly known as: CLEOCIN T Apply topically 2 (two) times daily.   hydrocortisone 2.5 % rectal cream Commonly known as: ANUSOL-HC Place 1 application rectally 2 (two) times daily as needed for hemorrhoids or anal itching.   Iron 325 (65 Fe) MG Tabs Take 1 tablet (325 mg total) by mouth daily. What changed: when to take this Changed by: Kathlene November, MD   MIRALAX PO Take 17 g by mouth daily.   triamterene-hydrochlorothiazide 37.5-25 MG tablet Commonly known as: MAXZIDE-25 Take 1 tablet by mouth daily.   Vitamin D (Ergocalciferol) 1.25 MG  (50000 UNIT) Caps capsule Commonly known as: DRISDOL Take 1 capsule (50,000 Units total) by mouth every 7 (seven) days.          Objective:   Physical Exam BP 117/82 (BP Location: Right Arm, Patient Position: Sitting)   Pulse 85   Temp 98.5 F (36.9 C) (Oral)   Resp 18   Ht 5\' 6"  (1.676 m)   Wt 199 lb 12.8 oz (90.6 kg)   LMP 07/11/2020   SpO2 99%   BMI 32.25 kg/m  General:   Well developed, NAD, BMI noted.  HEENT:  Normocephalic . Face symmetric, atraumatic Lungs:  CTA B Normal respiratory effort, no intercostal retractions, no accessory muscle use. Heart: RRR,  no murmur.  Abdomen:  Not distended, soft, non-tender.  + Mass lower abdomen similar to 11-2019 exam consistent with fibroids.  Not tender. Skin: Not pale. Not jaundice Lower extremities: no pretibial edema bilaterally  Neurologic:  alert & oriented X3.  Speech normal, gait appropriate for age and unassisted Psych--  Cognition and judgment appear intact.  Cooperative with normal attention span and concentration.  Behavior appropriate. No anxious or depressed appearing.     Assessment     Assessment   HTN Acne Headaches --- MRI (-), MRA 2 anerurysms?------> CT angio (-); saw Neuro, likely migraines, Rx imitrex Syncope-- 01-2014, 02-2016 (likely vaso-vagal)  Fibroid tumors, not on BCP H/o anemia, iron deficient,d/t DUB-fibroid tumors, had a colonoscopy 01/2020 (+ Polyps, 5 years) Integrative therapies: they Rx armour thyroid and testosterone    PLAN: HTN: Well-controlled, continue Carvedilol, Maxide. Fibroid tumors: Seen by gynecology, they are discussing surgery versus medications. History of anemia: Last hemoglobin increasing, self decrease iron supplements to 1 tablet daily.  Had a colonoscopy 01/2020. R > L abdominal pain: Had a CT 12/30/2019, no acute.  Pain has diminished. Preventive care: Plans to get a flu shot and Covid booster soon.  Declined a flu shot today. RTC is scheduled for CPX in  December   This visit occurred during the SARS-CoV-2 public health emergency.  Safety protocols were in place, including screening questions prior to the visit, additional usage of staff PPE, and extensive cleaning of exam room while observing appropriate contact time as indicated for disinfecting solutions.

## 2020-07-18 NOTE — Patient Instructions (Addendum)
See you in December.

## 2020-07-20 NOTE — Assessment & Plan Note (Signed)
HTN: Well-controlled, continue Carvedilol, Maxide. Fibroid tumors: Seen by gynecology, they are discussing surgery versus medications. History of anemia: Last hemoglobin increasing, self decrease iron supplements to 1 tablet daily.  Had a colonoscopy 01/2020. R > L abdominal pain: Had a CT 12/30/2019, no acute.  Pain has diminished. Preventive care: Plans to get a flu shot and Covid booster soon.  Declined a flu shot today. RTC is scheduled for CPX in December

## 2020-08-10 ENCOUNTER — Encounter: Payer: Self-pay | Admitting: Internal Medicine

## 2020-08-19 ENCOUNTER — Other Ambulatory Visit: Payer: Self-pay | Admitting: Internal Medicine

## 2020-08-29 ENCOUNTER — Encounter: Payer: Self-pay | Admitting: Internal Medicine

## 2020-08-29 ENCOUNTER — Encounter: Payer: Managed Care, Other (non HMO) | Admitting: Internal Medicine

## 2020-09-04 ENCOUNTER — Other Ambulatory Visit: Payer: Self-pay | Admitting: Internal Medicine

## 2020-09-17 LAB — HM MAMMOGRAPHY

## 2020-09-25 LAB — HM PAP SMEAR: HM Pap smear: NEGATIVE

## 2020-09-25 LAB — RESULTS CONSOLE HPV: CHL HPV: NEGATIVE

## 2020-10-22 ENCOUNTER — Encounter: Payer: Self-pay | Admitting: Internal Medicine

## 2020-10-22 ENCOUNTER — Telehealth (INDEPENDENT_AMBULATORY_CARE_PROVIDER_SITE_OTHER): Payer: Managed Care, Other (non HMO) | Admitting: Internal Medicine

## 2020-10-22 ENCOUNTER — Other Ambulatory Visit: Payer: Self-pay

## 2020-10-22 VITALS — BP 147/92 | Ht 66.0 in | Wt 195.0 lb

## 2020-10-22 DIAGNOSIS — B349 Viral infection, unspecified: Secondary | ICD-10-CM

## 2020-10-22 NOTE — Progress Notes (Signed)
Subjective:    Patient ID: Miranda [name], female    DOB: 07/07/1976, 45 y.o.   MRN: [id number]  DOS:  10/22/2020 Type of visit - description: Virtual Visit via Video Note  I connected with the above patient  by a video enabled telemedicine application and verified that I am speaking with the correct person using two identifiers.   THIS ENCOUNTER IS A VIRTUAL VISIT DUE TO COVID-19 - PATIENT WAS NOT SEEN IN THE OFFICE. PATIENT HAS CONSENTED TO VIRTUAL VISIT / TELEMEDICINE VISIT   Location of patient: home  Location of provider: office  Persons participating in the virtual visit: patient, provider   I discussed the limitations of evaluation and management by telemedicine and the availability of in person appointments. The patient expressed understanding and agreed to proceed.  Acute Symptoms a started 10/19/2020: Sudden onset of a moderate headache, chest congestion, nose congestion, on and off dry cough. She has been checking her temperature and she had no fever. With the onset of symptoms she got a Covid test and it came back negative.  Currently feeling better, headache has decreased, cough is less intense.      Review of Systems Denies vomiting or diarrhea.  She had mild nausea today. No rash, had a mild sore throat with the onset of symptoms. No chest pain or shortness of breath, mild chest "tightness".  Past Medical History:  Diagnosis Date  . Acne   . Allergic rhinitis   . Allergy   . Anemia   . Fibroids, intramural 10/22/2011  . GERD (gastroesophageal reflux disease)    past hx- occ prn Tums   . HA (headache) 07/2008   MRI (-), MRA 2 anerurysms?------> CT angio (-); saw Neuro, likely migraines, Rx imitrex  . Hemorrhoids   . Hypertension     Past Surgical History:  Procedure Laterality Date  . LAPAROTOMY  10/23/2011   Procedure: EXPLORATORY LAPAROTOMY;  Surgeon: Thornell Sartorius, MD;  Location: [facility];  Service: Gynecology;  Laterality: N/A;  thru incision  (laparotomy)/Myomyectomy  . WISDOM TOOTH EXTRACTION      Allergies as of 10/22/2020   No Known Allergies     Medication List       Accurate as of October 22, 2020  9:36 PM. If you have any questions, ask your nurse or doctor.        STOP taking these medications   Vitamin D (Ergocalciferol) 1.25 MG (50000 UNIT) Caps capsule Commonly known as: DRISDOL Stopped by: Kathlene November, MD     TAKE these medications   ALLEGRA PO Take by mouth daily as needed.   carvedilol 12.5 MG tablet Commonly known as: COREG Take 1 tablet (12.5 mg total) by mouth 2 (two) times daily with a meal.   clindamycin 1 % lotion Commonly known as: CLEOCIN T Apply topically 2 (two) times daily.   ferrous sulfate 325 (65 FE) MG tablet Take 1 tablet (325 mg total) by mouth 2 (two) times daily with a meal.   hydrocortisone 2.5 % rectal cream Commonly known as: ANUSOL-HC Place 1 application rectally 2 (two) times daily as needed for hemorrhoids or anal itching.   MIRALAX PO Take 17 g by mouth daily.   triamterene-hydrochlorothiazide 37.5-25 MG tablet Commonly known as: MAXZIDE-25 Take 1 tablet by mouth daily.          Objective:   Physical Exam BP (!) 147/92   Ht 5\' 6"  (1.676 m)   Wt 195 lb (88.5 kg)   LMP 10/16/2020 (  Exact Date)   SpO2 99%   BMI 31.47 kg/m  This is a virtual video visit, she is alert oriented x3, in no apparent distress.  No cough noted.    Assessment     Assessment   HTN Acne Headaches --- MRI (-), MRA 2 anerurysms?------> CT angio (-); saw Neuro, likely migraines, Rx imitrex Syncope-- 01-2014, 02-2016 (likely vaso-vagal)  Fibroid tumors, not on BCP H/o anemia, iron deficient,d/t DUB-fibroid tumors, had a colonoscopy 01/2020 (+ Polyps, 5 years) Integrative therapies: they Rx armour thyroid and testosterone    PLAN: Viral syndrome Sxs started 3 days ago, she is feeling somewhat better now, remaining symptoms are mild headache and cough. She had her COVID vaccine  booster 2 weeks ago.  Flu shot is pending. Suspect either a mild influenza, Covid or another viral syndrome. Plan: Conservative treatment with rest, fluids, Tylenol, Robitussin, call if not gradually improving. Recheck for COVID-19. Return to work in 5 days, sooner if she feels better and Covid test came back negative. She verbalized understanding   I discussed the assessment and treatment plan with the patient. The patient was provided an opportunity to ask questions and all were answered. The patient agreed with the plan and demonstrated an understanding of the instructions.   The patient was advised to call back or seek an in-person evaluation if the symptoms worsen or if the condition fails to improve as anticipated.

## 2020-10-22 NOTE — Progress Notes (Signed)
Pre visit review using our clinic review tool, if applicable. No additional management support is needed unless otherwise documented below in the visit note. 

## 2020-10-22 NOTE — Assessment & Plan Note (Signed)
Viral syndrome Sxs started 3 days ago, she is feeling somewhat better now, remaining symptoms are mild headache and cough. She had her COVID vaccine booster 2 weeks ago.  Flu shot is pending. Suspect either a mild influenza, Covid or another viral syndrome. Plan: Conservative treatment with rest, fluids, Tylenol, Robitussin, call if not gradually improving. Recheck for COVID-19. Return to work in 5 days, sooner if she feels better and Covid test came back negative. She verbalized understanding

## 2020-10-26 ENCOUNTER — Other Ambulatory Visit: Payer: Self-pay | Admitting: Internal Medicine

## 2020-10-26 MED ORDER — CARVEDILOL 12.5 MG PO TABS
12.5000 mg | ORAL_TABLET | Freq: Two times a day (BID) | ORAL | 6 refills | Status: DC
Start: 2020-10-26 — End: 2021-09-09

## 2020-11-27 ENCOUNTER — Other Ambulatory Visit: Payer: Self-pay | Admitting: Internal Medicine

## 2020-12-04 ENCOUNTER — Ambulatory Visit (INDEPENDENT_AMBULATORY_CARE_PROVIDER_SITE_OTHER): Payer: Managed Care, Other (non HMO) | Admitting: Internal Medicine

## 2020-12-04 ENCOUNTER — Encounter: Payer: Self-pay | Admitting: Internal Medicine

## 2020-12-04 ENCOUNTER — Other Ambulatory Visit: Payer: Self-pay

## 2020-12-04 VITALS — BP 116/80 | HR 85 | Temp 98.4°F | Resp 16 | Ht 66.0 in | Wt 204.2 lb

## 2020-12-04 DIAGNOSIS — E559 Vitamin D deficiency, unspecified: Secondary | ICD-10-CM | POA: Diagnosis not present

## 2020-12-04 DIAGNOSIS — Z0000 Encounter for general adult medical examination without abnormal findings: Secondary | ICD-10-CM | POA: Diagnosis not present

## 2020-12-04 DIAGNOSIS — I10 Essential (primary) hypertension: Secondary | ICD-10-CM

## 2020-12-04 DIAGNOSIS — D649 Anemia, unspecified: Secondary | ICD-10-CM

## 2020-12-04 DIAGNOSIS — Z23 Encounter for immunization: Secondary | ICD-10-CM

## 2020-12-04 NOTE — Progress Notes (Unsigned)
Subjective:    Patient ID: Miranda [name], female    DOB: [birth year], 45 y.o.   MRN: [id number]  DOS:  12/04/2020 Type of visit - description: CPX  Since the last office visit she is doing well. [place], when she came back she noticed some swelling in both legs, not an unusual things when she travels. Is already getting better. DUB: Under the care of gynecology, still bleeding heavily but less than before  Review of Systems Specifically denies chest pain no difficulty breathing  Other than above, a 14 point review of systems is negative      Past Medical History:  Diagnosis Date  . Acne   . Allergic rhinitis   . Allergy   . Anemia   . Fibroids, intramural 10/22/2011  . GERD (gastroesophageal reflux disease)    past hx- occ prn Tums   . HA (headache) 07/2008   MRI (-), MRA 2 anerurysms?------> CT angio (-); saw Neuro, likely migraines, Rx imitrex  . Hemorrhoids   . Hypertension     Past Surgical History:  Procedure Laterality Date  . LAPAROTOMY  10/23/2011   Myomectomy-- EXPLORATORY LAPAROTOMY;  Surgeon: Thornell Sartorius, MD;  Location: [facility];  Service: Gynecology;  Laterality: N/A;  thru incision (laparotomy)/Myomyectomy  . WISDOM TOOTH EXTRACTION      Allergies as of 12/04/2020   No Known Allergies     Medication List       Accurate as of December 04, 2020 11:59 PM. If you have any questions, ask your nurse or doctor.        STOP taking these medications   hydrocortisone 2.5 % rectal cream Commonly known as: ANUSOL-HC Stopped by: Kathlene November, MD     TAKE these medications   ALLEGRA PO Take by mouth daily as needed.   carvedilol 12.5 MG tablet Commonly known as: COREG Take 1 tablet (12.5 mg total) by mouth 2 (two) times daily with a meal.   clindamycin 1 % lotion Commonly known as: CLEOCIN T Apply topically 2 (two) times daily.   Iron 90 (18 Fe) MG Tabs Take by mouth. What changed: Another medication with the same name was  removed. Continue taking this medication, and follow the directions you see here. Changed by: Kathlene November, MD   MIRALAX PO Take 17 g by mouth daily.   triamterene-hydrochlorothiazide 37.5-25 MG tablet Commonly known as: MAXZIDE-25 Take 1 tablet by mouth daily.          Objective:   Physical Exam BP 116/80 (BP Location: Left Arm, Patient Position: Sitting, Cuff Size: Small)   Pulse 85   Temp 98.4 F (36.9 C) (Oral)   Resp 16   Ht 5\' 6"  (1.676 m)   Wt 204 lb 4 oz (92.6 kg)   SpO2 98%   BMI 32.97 kg/m  General: Well developed, NAD, BMI noted Neck: No  thyromegaly  HEENT:  Normocephalic . Face symmetric, atraumatic Lungs:  CTA B Normal respiratory effort, no intercostal retractions, no accessory muscle use. Heart: RRR,  no murmur.  Abdomen:  Not distended, soft, non-tender. No rebound or rigidity.   Lower extremities: Trace pretibial edema bilaterally  Skin: Exposed areas without rash. Not pale. Not jaundice Neurologic:  alert & oriented X3.  Speech normal, gait appropriate for age and unassisted Strength symmetric and appropriate for age.  Psych: Cognition and judgment appear intact.  Cooperative with normal attention span and concentration.  Behavior appropriate. No anxious or  depressed appearing.     Assessment    Assessment   HTN Acne Headaches --- MRI (-), MRA 2 anerurysms?------> CT angio (-); saw Neuro, likely migraines, Rx imitrex Syncope-- 01-2014, 02-2016 (likely vaso-vagal)  Fibroid tumors-DU B H/o anemia, iron deficient,d/t DUB-fibroid tumors, had a colonoscopy 01/2020 (+ Polyps, 5 years) Integrative therapies: they Rx progesterone  Vit D  Def  PLAN: Here for CPX HTN: BP excellent today, continue carvedilol, HCTZ, checking labs. DU B, fibroid tumors: Follow-up by gynecology, currently on progesterone (Rx by integrative therapies), bleeding is a still heavy but less than before. Anemia: Could not tolerate prescribed iron, started on iron  over-the-counter few days ago, unsure if she is tolerating better or not. We will check labs.  Continue OTCs. Vit D  Def: on OTCs, check levels RTC 1 year.    This visit occurred during the SARS-CoV-2 public health emergency.  Safety protocols were in place, including screening questions prior to the visit, additional usage of staff PPE, and extensive cleaning of exam room while observing appropriate contact time as indicated for disinfecting solutions.

## 2020-12-04 NOTE — Patient Instructions (Addendum)
Happy belated Miranda [name]!  Check the  blood pressure 2 or 3 times a month   BP GOAL is between 110/65 and  135/85. If it is consistently higher or lower, let me know   GO TO THE LAB : Get the blood work     [place], [place] back for a physical in 1 year

## 2020-12-05 ENCOUNTER — Encounter: Payer: Self-pay | Admitting: Internal Medicine

## 2020-12-05 LAB — CBC WITH DIFFERENTIAL/PLATELET
Basophils Absolute: 0.1 10*3/uL (ref 0.0–0.1)
Basophils Relative: 1.2 % (ref 0.0–3.0)
Eosinophils Absolute: 0.1 10*3/uL (ref 0.0–0.7)
Eosinophils Relative: 2.2 % (ref 0.0–5.0)
HCT: 33.9 % — ABNORMAL LOW (ref 36.0–46.0)
Hemoglobin: 10.9 g/dL — ABNORMAL LOW (ref 12.0–15.0)
Lymphocytes Relative: 36 % (ref 12.0–46.0)
Lymphs Abs: 2.3 10*3/uL (ref 0.7–4.0)
MCHC: 32.2 g/dL (ref 30.0–36.0)
MCV: 84.6 fl (ref 78.0–100.0)
Monocytes Absolute: 0.6 10*3/uL (ref 0.1–1.0)
Monocytes Relative: 8.8 % (ref 3.0–12.0)
Neutro Abs: 3.3 10*3/uL (ref 1.4–7.7)
Neutrophils Relative %: 51.8 % (ref 43.0–77.0)
Platelets: 365 10*3/uL (ref 150.0–400.0)
RBC: 4.01 Mil/uL (ref 3.87–5.11)
RDW: 14.8 % (ref 11.5–15.5)
WBC: 6.4 10*3/uL (ref 4.0–10.5)

## 2020-12-05 LAB — COMPREHENSIVE METABOLIC PANEL
ALT: 10 U/L (ref 0–35)
AST: 15 U/L (ref 0–37)
Albumin: 3.7 g/dL (ref 3.5–5.2)
Alkaline Phosphatase: 65 U/L (ref 39–117)
BUN: 14 mg/dL (ref 6–23)
CO2: 25 mEq/L (ref 19–32)
Calcium: 9.2 mg/dL (ref 8.4–10.5)
Chloride: 103 mEq/L (ref 96–112)
Creatinine, Ser: 0.83 mg/dL (ref 0.40–1.20)
GFR: 84.19 mL/min (ref >60.00)
Glucose, Bld: 76 mg/dL (ref 70–99)
Potassium: 4.1 mEq/L (ref 3.5–5.1)
Sodium: 136 mEq/L (ref 135–145)
Total Bilirubin: 0.3 mg/dL (ref 0.2–1.2)
Total Protein: 6.7 g/dL (ref 6.0–8.3)

## 2020-12-05 LAB — VITAMIN D 25 HYDROXY (VIT D DEFICIENCY, FRACTURES): VITD: 39 ng/mL (ref 30.00–100.00)

## 2020-12-05 LAB — LIPID PANEL
Cholesterol: 173 mg/dL (ref 0–200)
HDL: 52.4 mg/dL (ref >39.00)
LDL Cholesterol: 101 mg/dL — ABNORMAL HIGH (ref 0–99)
NonHDL: 120.58
Total CHOL/HDL Ratio: 3
Triglycerides: 96 mg/dL (ref 0.0–149.0)
VLDL: 19.2 mg/dL (ref 0.0–40.0)

## 2020-12-05 LAB — IRON: Iron: 38 ug/dL — ABNORMAL LOW (ref 42–145)

## 2020-12-05 LAB — FERRITIN: Ferritin: 10.1 ng/mL (ref 10.0–291.0)

## 2020-12-05 NOTE — Assessment & Plan Note (Signed)
Here for CPX HTN: BP excellent today, continue carvedilol, HCTZ, checking labs. DU B, fibroid tumors: Follow-up by gynecology, currently on progesterone (Rx by integrative therapies), bleeding is a still heavy but less than before. Anemia: Could not tolerate prescribed iron, started on iron over-the-counter few days ago, unsure if she is tolerating better or not. We will check labs.  Continue OTCs. Vit D  Def: on OTCs, check levels RTC 1 year.

## 2020-12-05 NOTE — Assessment & Plan Note (Signed)
-  Tdap 4-18 -covid vax x 3 - Requested flu shot.  See previous entry:  H/o localized allergic reaction to the flu shot (2016), saw Dr. Maudie Mercury, allergisty, received a flu shot >> tolerated well, pt reports a mild reaction; going forward, Dr Maudie Mercury ok pt to get the flu shot with 30-minute wait at the doctor's office. Will provide and ask her to stay for 30 minutes.  She agreed. - female care: sees Dr Melba Coon.  Pap smear and MMG 08-2020 ([facility]).   --[facility]: Colonoscopy 01/2020, next per GI.  Next per GI  --Labs:  CMP, FLP, CBC, iron, ferritin, vitamin D -Diet and exercise discussed

## 2020-12-08 ENCOUNTER — Other Ambulatory Visit: Payer: Self-pay | Admitting: Internal Medicine

## 2021-03-27 ENCOUNTER — Encounter: Payer: Self-pay | Admitting: Internal Medicine

## 2021-05-07 ENCOUNTER — Other Ambulatory Visit: Payer: Self-pay | Admitting: Obstetrics and Gynecology

## 2021-05-07 DIAGNOSIS — D259 Leiomyoma of uterus, unspecified: Secondary | ICD-10-CM

## 2021-05-15 ENCOUNTER — Ambulatory Visit
Admission: RE | Admit: 2021-05-15 | Discharge: 2021-05-15 | Disposition: A | Discharge status: Home / Self Care | Payer: Managed Care, Other (non HMO) | Source: Ambulatory Visit | Attending: Obstetrics and Gynecology | Admitting: Obstetrics and Gynecology

## 2021-05-15 ENCOUNTER — Other Ambulatory Visit: Payer: Self-pay

## 2021-05-15 DIAGNOSIS — D259 Leiomyoma of uterus, unspecified: Secondary | ICD-10-CM

## 2021-05-15 HISTORY — PX: IR RADIOLOGIST EVAL & MGMT: IMG5224

## 2021-05-15 NOTE — Consult Note (Signed)
Chief Complaint: Patient was seen in consultation today for uterine fibroids and menorrhagia.  At the request of Miranda [name]  Referring Physician(s): Miranda [name]  History of Present Illness: Miranda [name] is a 45 y.o. female with known uterine fibroids and history of a myomectomy in 2013.  Prior to the myomectomy, the patient's main complaint was urinary frequency and dyspareunia.  Those symptoms have resolved since the myomectomy but now the patient has significant heavy menstrual bleeding.  Bleeding hasincreased in the last year.  The bleeding will last 10 days with 3 to 5 days of heavy bleeding.  She can be changing pads every hour during these days of heavy bleeding.  Pregnancy history is G1, P0.  No plans for future pregnancies.  Patient is currently taking progesterone and using testosterone cream.  Patient would like to avoid hysterectomy at this time.  Past medical history is significant for hypertension and anemia.  Past Medical History:  Diagnosis Date   Acne    Allergic rhinitis    Allergy    Anemia    Fibroids, intramural 10/22/2011   GERD (gastroesophageal reflux disease)    past hx- occ prn Tums    HA (headache) 07/2008   MRI (-), MRA 2 anerurysms?------> CT angio (-); saw Neuro, likely migraines, Rx imitrex   Hemorrhoids    Hypertension     Past Surgical History:  Procedure Laterality Date   LAPAROTOMY  10/23/2011   Myomectomy-- EXPLORATORY LAPAROTOMY;  Surgeon: Thornell Sartorius, MD;  Location: [facility];  Service: Gynecology;  Laterality: N/A;  thru incision (laparotomy)/Myomyectomy   WISDOM TOOTH EXTRACTION      Allergies: Patient has no known allergies.  Medications: Prior to Admission medications   Medication Sig Start Date End Date Taking? Authorizing Provider  carvedilol (COREG) 12.5 MG tablet Take 1 tablet (12.5 mg total) by mouth 2 (two) times daily with a meal. 10/26/20   Paz, Alda Berthold, MD  clindamycin (CLEOCIN T) 1 % lotion Apply topically 2  (two) times daily.    [provider]  Ferrous Sulfate (IRON) 90 (18 Fe) MG TABS Take by mouth.    [provider]  Fexofenadine HCl (ALLEGRA PO) Take by mouth daily as needed.    [provider]  Polyethylene Glycol 3350 (MIRALAX PO) Take 17 g by mouth daily.    [provider]  triamterene-hydrochlorothiazide (MAXZIDE-25) 37.5-25 MG tablet Take 1 tablet by mouth daily. 12/10/20   Colon Branch, MD     Family History  Problem Relation Age of Onset   Diabetes Other        uncle   Hypertension Mother        M, aunt, GM   Leukemia Other        cousin   Breast cancer Other        aunt maternal    Cancer Maternal Aunt        Lung Cancer   Colon polyps Maternal Uncle    CAD Maternal Grandmother    Colon cancer Neg Hx    Allergic rhinitis Neg Hx    Angioedema Neg Hx    Asthma Neg Hx    Atopy Neg Hx    Eczema Neg Hx    Immunodeficiency Neg Hx    Urticaria Neg Hx    Esophageal cancer Neg Hx    Rectal cancer Neg Hx    Stomach cancer Neg Hx     Social History   Socioeconomic History   Marital status: Single  Spouse name: Not on file   Number of children: 0   Years of education: Not on file   Highest education level: Not on file  Occupational History   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 [employer]   Tobacco Use   Smoking status: Never   Smokeless tobacco: Never  Vaping Use   Vaping Use: Never used  Substance and Sexual Activity   Alcohol use: Yes    Comment: socially   Drug use: No   Sexual activity: Yes    Birth control/protection: Pill  Other Topics Concern   Not on file  Social History Narrative   Lives by herself ; most of family in [place], mother lives near by   Social Determinants of Health   Financial Resource Strain: Not on file  Food Insecurity: Not on file  Transportation Needs: Not on file  Physical Activity: Not on file  Stress: Not on file  Social Connections: Not on file     Review of Systems  Genitourinary:  Positive for  menstrual problem and vaginal bleeding.   Vital Signs: There were no vitals taken for this visit.  Physical Exam Constitutional:      Appearance: Normal appearance.  Cardiovascular:     Rate and Rhythm: Normal rate and regular rhythm.  Pulmonary:     Effort: Pulmonary effort is normal.     Breath sounds: Normal breath sounds.  Abdominal:     General: There is distension.     Tenderness: There is no abdominal tenderness.     Comments: Distention in the lower abdomen.  Musculoskeletal:     Right lower leg: No edema.     Left lower leg: No edema.  Neurological:     Mental Status: She is alert.       Imaging: Ultrasound report from 11/06/2020 demonstrates a 4.8 cm submucosal fibroid.  Labs:  CBC: Recent Labs    05/25/20 1119 12/04/20 1526  WBC  --  6.4  HGB 11.8* 10.9*  HCT 37.5 33.9*  PLT  --  365.0    Pap smear 09/18/2020 is negative for intraepithelial lesion or malignancy.  Endometrial biopsy 09/17/2020 demonstrate scant fragments of inactive endometrium set against a background of extensive blood.  No hyperplasia or malignancy.   COAGS: No results for input(s): INR, APTT in the last 8760 hours.  BMP: Recent Labs    12/04/20 1526  NA 136  K 4.1  CL 103  CO2 25  GLUCOSE 76  BUN 14  CALCIUM 9.2  CREATININE 0.83    LIVER FUNCTION TESTS: Recent Labs    12/04/20 1526  BILITOT 0.3  AST 15  ALT 10  ALKPHOS 65  PROT 6.7  ALBUMIN 3.7    TUMOR MARKERS: No results for input(s): AFPTM, CEA, CA199, CHROMGRNA in the last 8760 hours.  Assessment and Plan:  45 year old with uterine fibroids and menorrhagia.  Patient had a myomectomy in 2013.  Patient's heavy menstrual bleeding has been getting worse and patient is taking iron for anemia.  Patient would like to avoid a hysterectomy.  We had a long discussion about the uterine artery embolization procedure.  I believe the patient has a very good understanding of the procedure benefits and risks.  We  discussed the post embolization recovery which usually includes 1 night in the hospital for symptom management.  Patient will need to be out of work for approximately 2 weeks after the procedure for recovery.  We will need to obtain a pelvic MRI, with and without contrast, to  further assess patient's fibroid disease particular since she has a large submucosal fibroid based on previous ultrasound.  Assuming the patient has no contraindications based on MRI, I believe the patient would be a good candidate for uterine artery embolization procedure.  Patient is interested in the embolization procedure and we will schedule the patient for a pelvic MRI.  Thank you for this interesting consult.  I greatly enjoyed meeting Miranda [name] and look forward to participating in their care.  A copy of this report was sent to the requesting provider on this date.  Electronically Signed: Burman Riis 05/15/2021, 5:55 PM   I spent a total of  30 Minutes   in face to face in clinical consultation, greater than 50% of which was counseling/coordinating care for uterine fibroids and menorrhagia.

## 2021-05-16 ENCOUNTER — Other Ambulatory Visit: Payer: Self-pay | Admitting: Diagnostic Radiology

## 2021-05-16 ENCOUNTER — Other Ambulatory Visit: Payer: Self-pay | Admitting: Obstetrics and Gynecology

## 2021-05-16 DIAGNOSIS — D250 Submucous leiomyoma of uterus: Secondary | ICD-10-CM

## 2021-05-17 ENCOUNTER — Other Ambulatory Visit: Payer: Self-pay | Admitting: Obstetrics and Gynecology

## 2021-05-17 DIAGNOSIS — D250 Submucous leiomyoma of uterus: Secondary | ICD-10-CM

## 2021-06-05 ENCOUNTER — Encounter: Payer: Self-pay | Admitting: Internal Medicine

## 2021-06-05 DIAGNOSIS — D649 Anemia, unspecified: Secondary | ICD-10-CM

## 2021-06-07 ENCOUNTER — Ambulatory Visit
Admission: RE | Admit: 2021-06-07 | Discharge: 2021-06-07 | Disposition: A | Discharge status: Home / Self Care | Payer: Managed Care, Other (non HMO) | Source: Ambulatory Visit | Attending: Obstetrics and Gynecology | Admitting: Obstetrics and Gynecology

## 2021-06-07 DIAGNOSIS — D250 Submucous leiomyoma of uterus: Secondary | ICD-10-CM

## 2021-06-07 MED ORDER — GADOBENATE DIMEGLUMINE 529 MG/ML IV SOLN
18.0000 mL | Freq: Once | INTRAVENOUS | Status: AC | PRN
  Administered 2021-06-07: 18 mL via INTRAVENOUS

## 2021-06-11 ENCOUNTER — Other Ambulatory Visit (INDEPENDENT_AMBULATORY_CARE_PROVIDER_SITE_OTHER): Payer: Managed Care, Other (non HMO)

## 2021-06-11 DIAGNOSIS — D649 Anemia, unspecified: Secondary | ICD-10-CM | POA: Diagnosis not present

## 2021-06-11 LAB — CBC WITH DIFFERENTIAL/PLATELET
Basophils Absolute: 0.1 10*3/uL (ref 0.0–0.1)
Basophils Relative: 1 % (ref 0.0–3.0)
Eosinophils Absolute: 0.1 10*3/uL (ref 0.0–0.7)
Eosinophils Relative: 1.2 % (ref 0.0–5.0)
HCT: 37.2 % (ref 36.0–46.0)
Hemoglobin: 12 g/dL (ref 12.0–15.0)
Lymphocytes Relative: 37.8 % (ref 12.0–46.0)
Lymphs Abs: 2.1 10*3/uL (ref 0.7–4.0)
MCHC: 32.2 g/dL (ref 30.0–36.0)
MCV: 86.8 fl (ref 78.0–100.0)
Monocytes Absolute: 0.7 10*3/uL (ref 0.1–1.0)
Monocytes Relative: 11.7 % (ref 3.0–12.0)
Neutro Abs: 2.7 10*3/uL (ref 1.4–7.7)
Neutrophils Relative %: 48.3 % (ref 43.0–77.0)
Platelets: 352 10*3/uL (ref 150.0–400.0)
RBC: 4.28 Mil/uL (ref 3.87–5.11)
RDW: 15.9 % — ABNORMAL HIGH (ref 11.5–15.5)
WBC: 5.6 10*3/uL (ref 4.0–10.5)

## 2021-06-11 LAB — FERRITIN: Ferritin: 22.1 ng/mL (ref 10.0–291.0)

## 2021-06-11 LAB — IRON: Iron: 39 ug/dL — ABNORMAL LOW (ref 42–145)

## 2021-06-21 ENCOUNTER — Other Ambulatory Visit: Payer: Self-pay | Admitting: Diagnostic Radiology

## 2021-06-21 DIAGNOSIS — D250 Submucous leiomyoma of uterus: Secondary | ICD-10-CM

## 2021-07-08 ENCOUNTER — Other Ambulatory Visit: Payer: Self-pay | Admitting: Internal Medicine

## 2021-07-08 MED ORDER — IRON 90 (18 FE) MG PO TABS: 1.0000 | ORAL_TABLET | Freq: Every day | ORAL | 1 refills | Status: DC

## 2021-07-09 ENCOUNTER — Ambulatory Visit
Admission: RE | Admit: 2021-07-09 | Discharge: 2021-07-09 | Disposition: A | Discharge status: Home / Self Care | Payer: Managed Care, Other (non HMO) | Source: Ambulatory Visit | Attending: Diagnostic Radiology | Admitting: Diagnostic Radiology

## 2021-07-09 ENCOUNTER — Encounter: Payer: Self-pay | Admitting: *Deleted

## 2021-07-09 DIAGNOSIS — D250 Submucous leiomyoma of uterus: Secondary | ICD-10-CM

## 2021-07-09 HISTORY — PX: IR RADIOLOGIST EVAL & MGMT: IMG5224

## 2021-07-09 NOTE — Progress Notes (Signed)
Chief Complaint: Patient was seen in consultation today for review of pelvic MRI   Referring Physician(s): Sheronette Cousins  History of Present Illness: NUBIA ZIESMER is a 45 y.o. female with uterine fibroids and menorrhagia.  History of myomectomy in 2013.  Patient's heavy menstrual bleeding has been getting worse.  Patient recently underwent a pelvic MRI to see if she is a candidate for uterine artery embolization procedure.  Since the last visit, there has been no significant change in her heavy menstrual bleeding.  No new concerns.  Past Medical History:  Diagnosis Date   Acne    Allergic rhinitis    Allergy    Anemia    Fibroids, intramural 10/22/2011   GERD (gastroesophageal reflux disease)    past hx- occ prn Tums    HA (headache) 07/2008   MRI (-), MRA 2 anerurysms?------> CT angio (-); saw Neuro, likely migraines, Rx imitrex   Hemorrhoids    Hypertension     Past Surgical History:  Procedure Laterality Date   IR RADIOLOGIST EVAL & MGMT  05/15/2021   IR RADIOLOGIST EVAL & MGMT  07/09/2021   LAPAROTOMY  10/23/2011   Myomectomy-- EXPLORATORY LAPAROTOMY;  Surgeon: Thornell Sartorius, MD;  Location: [facility];  Service: Gynecology;  Laterality: N/A;  thru incision (laparotomy)/Myomyectomy   WISDOM TOOTH EXTRACTION      Allergies: Patient has no known allergies.  Medications: Prior to Admission medications   Medication Sig Start Date End Date Taking? Authorizing Provider  carvedilol (COREG) 12.5 MG tablet Take 1 tablet (12.5 mg total) by mouth 2 (two) times daily with a meal. 10/26/20   Paz, Alda Berthold, MD  clindamycin (CLEOCIN T) 1 % lotion Apply topically 2 (two) times daily.    [provider]  Ferrous Sulfate (IRON) 90 (18 Fe) MG TABS Take 1 tablet by mouth daily. 07/08/21   Colon Branch, MD  Fexofenadine HCl (ALLEGRA PO) Take by mouth daily as needed.    [provider]  Polyethylene Glycol 3350 (MIRALAX PO) Take 17 g by mouth daily.    [provider]  triamterene-hydrochlorothiazide (MAXZIDE-25) 37.5-25 MG tablet Take 1 tablet by mouth daily. 12/10/20   Colon Branch, MD     Family History  Problem Relation Age of Onset   Diabetes Other        uncle   Hypertension Mother        M, aunt, GM   Leukemia Other        cousin   Breast cancer Other        aunt maternal    Cancer Maternal Aunt        Lung Cancer   Colon polyps Maternal Uncle    CAD Maternal Grandmother    Colon cancer Neg Hx    Allergic rhinitis Neg Hx    Angioedema Neg Hx    Asthma Neg Hx    Atopy Neg Hx    Eczema Neg Hx    Immunodeficiency Neg Hx    Urticaria Neg Hx    Esophageal cancer Neg Hx    Rectal cancer Neg Hx    Stomach cancer Neg Hx     Social History   Socioeconomic History   Marital status: Single    Spouse name: Not on file   Number of children: 0   Years of education: Not on file   Highest education level: Not on file  Occupational History   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 [employer]   Tobacco Use  Smoking status: Never   Smokeless tobacco: Never  Vaping Use   Vaping Use: Never used  Substance and Sexual Activity   Alcohol use: Yes    Comment: socially   Drug use: No   Sexual activity: Yes    Birth control/protection: Pill  Other Topics Concern   Not on file  Social History Narrative   Lives by herself ; most of family in [place], mother lives near by   Social Determinants of Health   Financial Resource Strain: Not on file  Food Insecurity: Not on file  Transportation Needs: Not on file  Physical Activity: Not on file  Stress: Not on file  Social Connections: Not on file      Review of Systems  Genitourinary:  Positive for menstrual problem.   Vital Signs: BP (!) 144/82 (BP Location: Left Arm)   Pulse 74   SpO2 97%   Physical Exam Constitutional:      Appearance: Normal appearance. She is not ill-appearing.  Neurological:     Mental Status: She is alert.     Imaging:      MR PELVIS W WO CONTRAST  [[id number]]  Narrative:  CLINICAL DATA:  Uterine fibroids.  Treatment planning.   EXAM:  MRI PELVIS WITHOUT AND WITH CONTRAST   TECHNIQUE:  Multiplanar multisequence MR imaging of the pelvis was performed  both before and after administration of intravenous contrast.   CONTRAST:  60mL MULTIHANCE GADOBENATE DIMEGLUMINE 529 MG/ML IV SOLN   COMPARISON:  None.   FINDINGS:  Lower Urinary Tract: No bladder or urethral abnormality identified.   Bowel:  Unremarkable visualized pelvic bowel loops.   Vascular/Lymphatic: No pathologically enlarged lymph nodes or other  significant abnormality.   Reproductive:   -- Uterus: Measures 18.4 x 11.9 x 11.8 cm (volume = 1350 cm^3).  Numerous fibroids are seen which involve the uterus diffusely, and  are predominantly intramural in location. The largest fibroid  measures 8.5 cm in maximum diameter in the right posterior upper  uterine corpus, which is submucosal in location and displaces the  endometrium to the left. T1 hyperintense fluid in the upper portion  of the endometrial cavity is consistent with blood products. The 2nd  largest fibroid is intramural in location in the right anterior  corpus, and measures 6.7 cm in maximum diameter. Several small  fibroids are seen which are subserosal in location. Cervix and  vagina are unremarkable in appearance.   -- Intracavitary fibroids:  None.   -- Pedunculated fibroids: None.   -- Fibroid contrast enhancement: All fibroids show contrast  enhancement, without significant degeneration/devascularization  except for the 6.7 cm fibroid in the right anterior corpus which  shows central degeneration.   -- Right ovary: A mildly complex cystic lesion is seen in the high  right adnexa which is incompletely visualized on today's exam due to  its location. This measures 6.0 x 3.8 cm on image 2/5. It contains  several internal septations, but no definite mural nodules or soft  tissue component.    -- Left ovary:  Appears normal.  No mass identified.   Other: Small amount of free fluid noted in pelvic cul-de-sac.   Musculoskeletal:  Unremarkable.   IMPRESSION:  Diffuse uterine involvement by multiple fibroids. The largest  measuring 8.5 cm and is submucosal in location.   No intracavitary or pedunculated fibroids identified.   6 cm complex cystic lesion in the high right adnexa, which is  suboptimally visualized due to its location. Recommend follow-up by  pelvic ultrasound in 6-12 weeks.    Electronically Signed    By: Marlaine Hind M.D.    On: 06/10/2021 12:13        Labs:  CBC: Recent Labs    12/04/20 1526 06/11/21 1601  WBC 6.4 5.6  HGB 10.9* 12.0  HCT 33.9* 37.2  PLT 365.0 352.0    COAGS: No results for input(s): INR, APTT in the last 8760 hours.  BMP: Recent Labs    12/04/20 1526  NA 136  K 4.1  CL 103  CO2 25  GLUCOSE 76  BUN 14  CALCIUM 9.2  CREATININE 0.83    LIVER FUNCTION TESTS: Recent Labs    12/04/20 1526  BILITOT 0.3  AST 15  ALT 10  ALKPHOS 65  PROT 6.7  ALBUMIN 3.7    TUMOR MARKERS: No results for input(s): AFPTM, CEA, CA199, CHROMGRNA in the last 8760 hours.  Assessment and Plan:  45 year old with uterine fibroids and menorrhagia.  I have reviewed the patient's pelvic MRI and she has multiple uterine fibroids.  There is a large submucosal fibroid measuring up to 8.5 cm which displaces the endometrium towards the left.  I shared my concerns with the patient that this large submucosal fibroid could cause prolonged vaginal discharge following the uterine artery embolization procedure.  This large submucosal fibroid may not cause any issues but based on its size and location I do think there is risk for prolonged discharge related to infarction of this large submucosal fibroid.  Otherwise, the patient is a good candidate for the uterine artery embolization procedure.  We reviewed the procedure details and the expectations  following the procedure including the postembolization syndrome.  Explained to the patient that she would likely need to be out of work for 2 weeks and that she should expect significant pain and cramping immediately following the procedure.  I believe the patient has a very good understanding of the procedure benefits and potential risks.  Patient is still undecided and she will contact our office if she would like to proceed with the uterine artery embolization procedure.     Electronically Signed: Burman Riis 07/09/2021, 3:44 PM   I spent a total of    10 Minutes in face to face in clinical consultation, greater than 50% of which was counseling/coordinating care for uterine fibroids and menorrhagia.  Patient ID: ALIRA FRETWELL, female   DOB: [birth year], 44 y.o.   MRN: [id number]

## 2021-09-06 ENCOUNTER — Other Ambulatory Visit: Payer: Self-pay | Admitting: Internal Medicine

## 2021-10-18 ENCOUNTER — Other Ambulatory Visit (HOSPITAL_COMMUNITY): Payer: Self-pay | Admitting: Diagnostic Radiology

## 2021-10-18 DIAGNOSIS — D259 Leiomyoma of uterus, unspecified: Secondary | ICD-10-CM

## 2021-11-07 ENCOUNTER — Encounter: Payer: Self-pay | Admitting: Internal Medicine

## 2021-11-13 ENCOUNTER — Telehealth: Payer: Self-pay | Admitting: Internal Medicine

## 2021-11-13 LAB — HM MAMMOGRAPHY

## 2021-11-13 NOTE — Telephone Encounter (Signed)
Spoke w/ Pt- informed of recommendations. Pt verbalized understanding. Med list updated.

## 2021-11-13 NOTE — Telephone Encounter (Signed)
Spoke w/ Miranda [name]- she informed she has been having dizzy spells and brain fog, she related this to her low Hgb. She saw her on site nurse yesterday at her job and BP was 112/71, this morning w/o meds was 111/64, the nurse at her work wanted her to call as she thought that maybe low BP's were causing her sx's. Please advise.

## 2021-11-13 NOTE — Telephone Encounter (Signed)
Pt stated a couple months ago her iron and hemoglobin levels were low and she thought that was why she has been tired. She went to see her on site nurse at work today and they told her her blood pressure was low. She is wondering if she needs to continue her bp meds if her bp is low and she is having a bit of brain fog. Please advise.

## 2021-11-13 NOTE — Telephone Encounter (Signed)
Advise patient: - Her BPs are minimally low, as a trial we could decrease Maxide to half tablet daily. - Monitor BPs after she decreases Maxide. - Call with BP  readings in 2 weeks. - If she continues with fatigue arrange office visit, we probably need to recheck a CBC.

## 2021-11-19 ENCOUNTER — Ambulatory Visit: Payer: Managed Care, Other (non HMO) | Admitting: Internal Medicine

## 2021-11-19 ENCOUNTER — Encounter: Payer: Self-pay | Admitting: Internal Medicine

## 2021-11-19 VITALS — BP 142/86 | HR 87 | Temp 98.5°F | Resp 16 | Ht 66.0 in | Wt 208.1 lb

## 2021-11-19 DIAGNOSIS — N938 Other specified abnormal uterine and vaginal bleeding: Secondary | ICD-10-CM | POA: Diagnosis not present

## 2021-11-19 DIAGNOSIS — R1030 Lower abdominal pain, unspecified: Secondary | ICD-10-CM

## 2021-11-19 DIAGNOSIS — R109 Unspecified abdominal pain: Secondary | ICD-10-CM

## 2021-11-19 LAB — CBC WITH DIFFERENTIAL/PLATELET
Basophils Absolute: 0.1 10*3/uL (ref 0.0–0.1)
Basophils Relative: 1.3 % (ref 0.0–3.0)
Eosinophils Absolute: 0.1 10*3/uL (ref 0.0–0.7)
Eosinophils Relative: 0.7 % (ref 0.0–5.0)
HCT: 34 % — ABNORMAL LOW (ref 36.0–46.0)
Hemoglobin: 10.7 g/dL — ABNORMAL LOW (ref 12.0–15.0)
Lymphocytes Relative: 18 % (ref 12.0–46.0)
Lymphs Abs: 1.7 10*3/uL (ref 0.7–4.0)
MCHC: 31.4 g/dL (ref 30.0–36.0)
MCV: 83 fl (ref 78.0–100.0)
Monocytes Absolute: 0.7 10*3/uL (ref 0.1–1.0)
Monocytes Relative: 8.1 % (ref 3.0–12.0)
Neutro Abs: 6.6 10*3/uL (ref 1.4–7.7)
Neutrophils Relative %: 71.9 % (ref 43.0–77.0)
Platelets: 415 10*3/uL — ABNORMAL HIGH (ref 150.0–400.0)
RBC: 4.09 Mil/uL (ref 3.87–5.11)
RDW: 14.9 % (ref 11.5–15.5)
WBC: 9.2 10*3/uL (ref 4.0–10.5)

## 2021-11-19 LAB — COMPREHENSIVE METABOLIC PANEL
ALT: 17 U/L (ref 0–35)
AST: 14 U/L (ref 0–37)
Albumin: 4.2 g/dL (ref 3.5–5.2)
Alkaline Phosphatase: 58 U/L (ref 39–117)
BUN: 10 mg/dL (ref 6–23)
CO2: 28 mEq/L (ref 19–32)
Calcium: 9.4 mg/dL (ref 8.4–10.5)
Chloride: 103 mEq/L (ref 96–112)
Creatinine, Ser: 0.92 mg/dL (ref 0.40–1.20)
GFR: 73.91 mL/min (ref >60.00)
Glucose, Bld: 83 mg/dL (ref 70–99)
Potassium: 4.5 mEq/L (ref 3.5–5.1)
Sodium: 137 mEq/L (ref 135–145)
Total Bilirubin: 0.2 mg/dL (ref 0.2–1.2)
Total Protein: 7 g/dL (ref 6.0–8.3)

## 2021-11-19 LAB — POCT URINE PREGNANCY: Preg Test, Ur: NEGATIVE

## 2021-11-19 MED ORDER — DICYCLOMINE HCL 20 MG PO TABS: 20.0000 mg | ORAL_TABLET | Freq: Four times a day (QID) | ORAL | 0 refills | Status: DC

## 2021-11-19 NOTE — Patient Instructions (Signed)
Please go to the lab for blood work and provide more urine if possible.  Rest  Fluids  Continue the nausea medication, Imodium over-the-counter.  I sent a prescription called Bentyl to use as needed for cramps  Call if not gradually better  ER if: Fever, chills, increased abdominal pain.  Severe constipation.  Please call your gynecologist due to the vaginal bleeding.  They may need to see you this week.

## 2021-11-19 NOTE — Progress Notes (Signed)
Subjective:    Patient ID: Miranda [name], female    DOB: 05/14/1976, 46 y.o.   MRN: [id number]  DOS:  11/19/2021 Type of visit - description:   acute  Symptoms started 2 days ago: Lower abdominal cramps associated with loose stools.  She did see few drops of fresh blood with the stools (thinks bleeding is related to irritated hemorrhoids due to several BMs)  She has some nausea. She was evaluated by somebody (E-visit) Rx Zofran and Imodium which she took. She overall feels somewhat better. Last night she was feeling pretty good but today at 2 AM she woke up with lower abdominal cramps again, this time with no diarrhea or nausea.  Also, her menses  started about 10 days ago and ended 4 days ago however at this point she has been spotting.  He denies fever chills Appetite is decreased Denies dysuria or gross hematuria.  No vaginal discharge.  she reports that 2 people at her office have a stomach virus  Review of Systems See above   Past Medical History:  Diagnosis Date   Acne    Allergic rhinitis    Allergy    Anemia    Fibroids, intramural 10/22/2011   GERD (gastroesophageal reflux disease)    past hx- occ prn Tums    HA (headache) 07/2008   MRI (-), MRA 2 anerurysms?------> CT angio (-); saw Neuro, likely migraines, Rx imitrex   Hemorrhoids    Hypertension     Past Surgical History:  Procedure Laterality Date   IR RADIOLOGIST EVAL & MGMT  05/15/2021   IR RADIOLOGIST EVAL & MGMT  07/09/2021   LAPAROTOMY  10/23/2011   Myomectomy-- EXPLORATORY LAPAROTOMY;  Surgeon: Thornell Sartorius, MD;  Location: [facility];  Service: Gynecology;  Laterality: N/A;  thru incision (laparotomy)/Myomyectomy   WISDOM TOOTH EXTRACTION      Current Outpatient Medications  Medication Instructions   carvedilol (COREG) 12.5 mg, Oral, 2 times daily with meals   clindamycin (CLEOCIN T) 1 % lotion Topical, 2 times daily   dicyclomine (BENTYL) 20 mg, Oral, Every 6 hours   Ferrous Sulfate (IRON) 90 (18 Fe)  MG TABS 1 tablet, Oral, Daily   Fexofenadine HCl (ALLEGRA PO) Oral, Daily PRN   ondansetron (ZOFRAN-ODT) 4 mg, Oral, Every 8 hours PRN   Polyethylene Glycol 3350 (MIRALAX PO) 17 g, Oral, Daily   Progesterone 40 % CREA Does not apply, Rx elsewhere   triamterene-hydrochlorothiazide (MAXZIDE-25) 37.5-25 MG tablet 0.5 tablets, Oral, Daily       Objective:   Physical Exam BP (!) 142/86 (BP Location: Left Arm, Patient Position: Sitting, Cuff Size: Normal)    Pulse 87    Temp 98.5 F (36.9 C) (Oral)    Resp 16    Ht 5\' 6"  (1.676 m)    Wt 208 lb 2 oz (94.4 kg)    LMP 11/08/2021 (Exact Date)    SpO2 98%    BMI 33.59 kg/m  General:   Well developed, NAD, BMI noted. HEENT:  Normocephalic . Face symmetric, atraumatic Lungs:  CTA B Normal respiratory effort, no intercostal retractions, no accessory muscle use. Heart: RRR,  no murmur.  Abdomen: Soft, uterus is enlarged and goes up to almost the umbilicus.  It is not tender itself.  She is externally tender at the left lower quadrant without mass or rebound. Right lower quadrant no TTP. No distention. Lower extremities: no pretibial edema bilaterally  Skin: Not pale. Not jaundice Neurologic:  alert & oriented X3.  Speech normal, gait appropriate for age and unassisted Psych--  Cognition and judgment appear intact.  Cooperative with normal attention span and concentration.  Behavior appropriate. No anxious or depressed appearing.      Assessment      Assessment   HTN Acne Headaches --- MRI (-), MRA 2 anerurysms?------> CT angio (-); saw Neuro, likely migraines, Rx imitrex Syncope-- 01-2014, 02-2016 (likely vaso-vagal)  Fibroid tumors-DU B H/o anemia, iron deficient,d/t DUB-fibroid tumors, had a colonoscopy 01/2020 (+ Polyps, 5 years) Integrative therapies: they Rx progesterone  Vit D  Def  PLAN: Gastroenteritis: Most likely patient has gastroenteritis characterized by lower abdominal cramps and currently with some tenderness at the  left lower abdomen. I have seen other patients in the community with similar symptoms. Two pt's coworkers have a stomach virus. Confounding factor includes the fact that she started spotting after her last period. UPT today: Negative Urine looks red, but again patient is spotting. Plan: CMP, CBC, UA, urine culture. Otherwise we will treat as gastroenteritis with Zofran, Imodium if needed, Bentyl. Also encouraged to reach out to her gynecologist.   This visit occurred during the SARS-CoV-2 public health emergency.  Safety protocols were in place, including screening questions prior to the visit, additional usage of staff PPE, and extensive cleaning of exam room while observing appropriate contact time as indicated for disinfecting solutions.

## 2021-11-20 LAB — URINALYSIS, ROUTINE W REFLEX MICROSCOPIC
Bilirubin Urine: NEGATIVE
Ketones, ur: NEGATIVE
Leukocytes,Ua: NEGATIVE
Nitrite: NEGATIVE
Specific Gravity, Urine: <=1.005 — AB (ref 1.000–1.030)
Total Protein, Urine: NEGATIVE
Urine Glucose: NEGATIVE
Urobilinogen, UA: 0.2 (ref 0.0–1.0)
pH: 6 (ref 5.0–8.0)

## 2021-11-20 LAB — URINE CULTURE
MICRO NUMBER:: 13036696
Result:: NO GROWTH
SPECIMEN QUALITY:: ADEQUATE

## 2021-11-21 ENCOUNTER — Other Ambulatory Visit: Payer: Self-pay | Admitting: Internal Medicine

## 2021-11-21 NOTE — Assessment & Plan Note (Signed)
Gastroenteritis: Most likely patient has gastroenteritis characterized by lower abdominal cramps and currently with some tenderness at the left lower abdomen. I have seen other patients in the community with similar symptoms. Two pt's coworkers have a stomach virus. Confounding factor includes the fact that she started spotting after her last period. UPT today: Negative Urine looks red, but again patient is spotting. Plan: CMP, CBC, UA, urine culture. Otherwise we will treat as gastroenteritis with Zofran, Imodium if needed, Bentyl. Also encouraged to reach out to her gynecologist.

## 2021-12-04 ENCOUNTER — Encounter: Payer: Self-pay | Admitting: Internal Medicine

## 2021-12-04 ENCOUNTER — Ambulatory Visit (INDEPENDENT_AMBULATORY_CARE_PROVIDER_SITE_OTHER): Payer: Managed Care, Other (non HMO) | Admitting: Internal Medicine

## 2021-12-04 VITALS — BP 126/82 | HR 92 | Temp 98.3°F | Resp 16 | Ht 66.0 in | Wt 205.2 lb

## 2021-12-04 DIAGNOSIS — E673 Hypervitaminosis D: Secondary | ICD-10-CM

## 2021-12-04 DIAGNOSIS — I10 Essential (primary) hypertension: Secondary | ICD-10-CM | POA: Diagnosis not present

## 2021-12-04 DIAGNOSIS — Z0000 Encounter for general adult medical examination without abnormal findings: Secondary | ICD-10-CM

## 2021-12-04 NOTE — Patient Instructions (Signed)
Check the  blood pressure regularly ?BP GOAL is between 110/65 and  135/85. ?If it is consistently higher or lower, let me know ? ?Proceed with your COVID-vaccine as you are planning ? ?GO TO THE LAB : Get the blood work   ? ? ?[place], [place] ?Come back for a physical exam in 1 year ?

## 2021-12-04 NOTE — Progress Notes (Signed)
? ?Subjective:  ? ? Patient ID: Miranda [name], female    DOB: 01/22/1976, 46 y.o.   MRN: [id number] ? ?DOS:  12/04/2021 ?Type of visit - description: CPX ? ?Here for CPX ?Was seen with gastroenteritis, see last visit, symptoms quickly resolved. ?Currently doing well. ?Still has heavy vaginal bleeding although is better than few years ago. ? ?Review of Systems ? ?Other than above, a 14 point review of systems is negative  ? ? ?Past Medical History:  ?Diagnosis Date  ? Acne   ? Allergic rhinitis   ? Allergy   ? Anemia   ? Fibroids, intramural 10/22/2011  ? GERD (gastroesophageal reflux disease)   ? past hx- occ prn Tums   ? HA (headache) 07/2008  ? MRI (-), MRA 2 anerurysms?------> CT angio (-); saw Neuro, likely migraines, Rx imitrex  ? Hemorrhoids   ? Hypertension   ? ? ?Past Surgical History:  ?Procedure Laterality Date  ? IR RADIOLOGIST EVAL & MGMT  05/15/2021  ? IR RADIOLOGIST EVAL & MGMT  07/09/2021  ? LAPAROTOMY  10/23/2011  ? Myomectomy-- EXPLORATORY LAPAROTOMY;  Surgeon: Thornell Sartorius, MD;  Location: [facility];  Service: Gynecology;  Laterality: N/A;  thru incision (laparotomy)/Myomyectomy  ? WISDOM TOOTH EXTRACTION    ? ?Social History  ? ?Socioeconomic History  ? Marital status: Single  ?  Spouse name: Not on file  ? Number of children: 0  ? Years of education: Not on file  ? Highest education level: Not on file  ?Occupational History  ? 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 Smoking status: Never  ? Smokeless tobacco: Never  ?Vaping Use  ? Vaping Use: Never used  ?Substance and Sexual Activity  ? Alcohol use: Yes  ?  Comment: socially  ? Drug use: No  ? Sexual activity: Yes  ?  Birth control/protection: Pill  ?Other Topics Concern  ? Not on file  ?Social History Narrative  ? Lives by herself ; most of family in [place], mother lives near by  ? ?Social Determinants of Health  ? ?Financial Resource Strain: Not on file  ?Food Insecurity: Not on file  ?Transportation Needs: Not on file  ?Physical Activity: Not on  file  ?Stress: Not on file  ?Social Connections: Not on file  ?Intimate Partner Violence: Not on file  ? ? ?Current Outpatient Medications  ?Medication Instructions  ? carvedilol (COREG) 12.5 mg, Oral, 2 times daily with meals  ? clindamycin (CLEOCIN T) 1 % lotion Topical, 2 times daily  ? Ferrous Sulfate (IRON) 90 (18 Fe) MG TABS 1 tablet, Oral, Daily  ? Fexofenadine HCl (ALLEGRA PO) Oral, Daily PRN  ? ondansetron (ZOFRAN-ODT) 4 mg, Oral, Every 8 hours PRN  ? Progesterone 40 % CREA by Does not apply route. Rx elsewhere  ? triamterene-hydrochlorothiazide (MAXZIDE-25) 37.5-25 MG tablet 0.5 tablets, Oral, Daily  ? ? ?   ?Objective:  ? Physical Exam ?BP 126/82 (BP Location: Left Arm, Patient Position: Sitting, Cuff Size: Small)   Pulse 92   Temp 98.3 ?F (36.8 ?C) (Oral)   Resp 16   Ht '5\' 6"'$  (1.676 m)   Wt 205 lb 4 oz (93.1 kg)   LMP 11/08/2021 (Exact Date)   SpO2 97%   BMI 33.13 kg/m?  ?General: ?Well developed, NAD, BMI noted ?Neck: No  thyromegaly  ?HEENT:  ?Normocephalic . Face symmetric, atraumatic ?Lungs:  ?CTA B ?Normal respiratory effort, no intercostal retractions, no accessory muscle use. ?Heart: RRR,  no murmur.  ?  Abdomen:  ?Not distended, soft, non-tender. No rebound or rigidity.   ?Lower extremities: no pretibial edema bilaterally  ?Skin: Exposed areas without rash. Not pale. Not jaundice ?Neurologic:  ?alert & oriented X3.  ?Speech normal, gait appropriate for age and unassisted ?Strength symmetric and appropriate for age.  ?Psych: ?Cognition and judgment appear intact.  ?Cooperative with normal attention span and concentration.  ?Behavior appropriate. ?No anxious or depressed appearing. ? ?   ?Assessment   ? ?  ?Assessment   ?HTN ?Acne ?Headaches --- MRI (-), MRA 2 anerurysms?------> CT angio (-); saw Neuro, likely migraines, Rx imitrex ?Syncope-- 01-2014, 02-2016 (likely vaso-vagal)  ?Fibroid tumors-DU B ?H/o anemia, iron deficient,d/t DUB-fibroid tumors, had a colonoscopy 01/2020 (+ Polyps, 5  years) ?Integrative therapies: they Rx progesterone  ?Vit D  Def ? ?PLAN: ?Here for CPX ?HTN: BP is very good, continue carvedilol, Maxide, monitor BPs. ?Fibroid tumors, still symptomatic with DUB, to have a a procedure soon. ?Anemia: Continue iron, last hemoglobin was slightly low. ?RTC 1 year ? ? ?This visit occurred during the SARS-CoV-2 public health emergency.  Safety protocols were in place, including screening questions prior to the visit, additional usage of staff PPE, and extensive cleaning of exam room while observing appropriate contact time as indicated for disinfecting solutions.  ? ?

## 2021-12-05 ENCOUNTER — Encounter: Payer: Self-pay | Admitting: Internal Medicine

## 2021-12-05 LAB — LIPID PANEL
Cholesterol: 156 mg/dL (ref 0–200)
HDL: 40 mg/dL (ref >39.00)
LDL Cholesterol: 100 mg/dL — ABNORMAL HIGH (ref 0–99)
NonHDL: 115.5
Total CHOL/HDL Ratio: 4
Triglycerides: 76 mg/dL (ref 0.0–149.0)
VLDL: 15.2 mg/dL (ref 0.0–40.0)

## 2021-12-05 LAB — TSH: TSH: 1.69 u[IU]/mL (ref 0.35–5.50)

## 2021-12-05 LAB — VITAMIN D 25 HYDROXY (VIT D DEFICIENCY, FRACTURES): VITD: 42.82 ng/mL (ref 30.00–100.00)

## 2021-12-05 NOTE — Assessment & Plan Note (Signed)
?-  Tdap 4-18 ?-covid vax: booster rec ?-Flu shot: She received a flu shot last year without complications.  Has not gotten one this season, it is March already, recommend next flu shot by September 2023 with precautions as previously described ?(H/o localized allergic reaction to the flu shot (2016), saw Dr. Maudie Mercury, allergisty, received a flu shot >> tolerated well, pt reports a mild reaction; going forward, Dr Maudie Mercury ok pt to get the flu shot with 30-minute wait at the [place] office.)  ?- female care: sees gyn, PAP 08-2020 and  MMG  10/2021 ([facility]).   ?--[facility]: Colonoscopy 01/2020, next 5 years per GI letter ?--Labs: Recent labs reviewed we will get a   Vitamin D, FLP, TSH ?-Diet and exercise discussed ? ?  ?

## 2021-12-05 NOTE — Assessment & Plan Note (Signed)
Here for CPX ?HTN: BP is very good, continue carvedilol, Maxide, monitor BPs. ?Fibroid tumors, still symptomatic with DUB, to have a a procedure soon. ?Anemia: Continue iron, last hemoglobin was slightly low. ?RTC 1 year ?

## 2021-12-06 ENCOUNTER — Encounter: Payer: Self-pay | Admitting: Internal Medicine

## 2021-12-16 ENCOUNTER — Other Ambulatory Visit: Payer: Self-pay | Admitting: Internal Medicine

## 2021-12-24 ENCOUNTER — Other Ambulatory Visit (HOSPITAL_COMMUNITY): Payer: Self-pay | Admitting: Physician Assistant

## 2021-12-25 ENCOUNTER — Other Ambulatory Visit: Payer: Self-pay | Admitting: Internal Medicine

## 2021-12-25 NOTE — H&P (Signed)
Chief Complaint: Patient was seen in consultation today for pelvic arteriogram with possible uterine artery embolization at the request of Henn,Adam  Referring Physician(s): Henn,Adam  Supervising Physician: Richarda Overlie  Patient Status: [facility] - Out-pt  History of Present Illness: Miranda [name] is a 46 y.o. female w/ PMH of anemia, menorrhagia, intramural fibroids, myomectomy, GERD and HTN. Pt consulted with Dr. Richarda Overlie, IR, for treatment of uterine fibroids with uterine artery emobolization. Pt decided at that time that she wanted to proceed with pelvic arteriogram with possible uterine artery embolization procedure.   Past Medical History:  Diagnosis Date   Acne    Allergic rhinitis    Allergy    Anemia    Fibroids, intramural 10/22/2011   GERD (gastroesophageal reflux disease)    past hx- occ prn Tums    HA (headache) 07/2008   MRI (-), MRA 2 anerurysms?------> CT angio (-); saw Neuro, likely migraines, Rx imitrex   Hemorrhoids    Hypertension     Past Surgical History:  Procedure Laterality Date   IR RADIOLOGIST EVAL & MGMT  05/15/2021   IR RADIOLOGIST EVAL & MGMT  07/09/2021   LAPAROTOMY  10/23/2011   Myomectomy-- EXPLORATORY LAPAROTOMY;  Surgeon: Sherron Monday, MD;  Location: [facility];  Service: Gynecology;  Laterality: N/A;  thru incision (laparotomy)/Myomyectomy   WISDOM TOOTH EXTRACTION      Allergies: Pork-derived products  Medications: Prior to Admission medications   Medication Sig Start Date End Date Taking? Authorizing Provider  carvedilol (COREG) 12.5 MG tablet TAKE 1 TABLET (12.5 MG TOTAL) BY MOUTH 2 (TWO) TIMES DAILY WITH A MEAL. 09/09/21   Wanda Plump, MD  clindamycin (CLEOCIN T) 1 % lotion Apply topically 2 (two) times daily.    [provider]  Ferrous Sulfate (IRON) 90 (18 Fe) MG TABS Take 1 tablet by mouth daily. 07/08/21   Wanda Plump, MD  Fexofenadine HCl (ALLEGRA PO) Take by mouth daily as needed.    [provider]   ondansetron (ZOFRAN-ODT) 4 MG disintegrating tablet Take 4 mg by mouth every 8 (eight) hours as needed. 11/17/21   [provider]  Progesterone 40 % CREA by Does not apply route. Rx elsewhere Patient not taking: Reported on 12/04/2021    [provider]  triamterene-hydrochlorothiazide (MAXZIDE-25) 37.5-25 MG tablet Take 0.5 tablets by mouth daily. 11/21/21   Wanda Plump, MD     Family History  Problem Relation Age of Onset   Diabetes Other        uncle   Hypertension Mother        M, aunt, GM   Leukemia Other        cousin   Breast cancer Other        aunt maternal    Cancer Maternal Aunt        Lung Cancer   Colon polyps Maternal Uncle    CAD Maternal Grandmother    Colon cancer Neg Hx    Allergic rhinitis Neg Hx    Angioedema Neg Hx    Asthma Neg Hx    Atopy Neg Hx    Eczema Neg Hx    Immunodeficiency Neg Hx    Urticaria Neg Hx    Esophageal cancer Neg Hx    Rectal cancer Neg Hx    Stomach cancer Neg Hx     Social History   Socioeconomic History   Marital status: Single    Spouse name: Not on file   Number of  children: 0   Years of education: Not on file   Highest education level: Not on file  Occupational History   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   Tobacco Use   Smoking status: Never   Smokeless tobacco: Never  Vaping Use   Vaping Use: Never used  Substance and Sexual Activity   Alcohol use: Yes    Comment: socially   Drug use: No   Sexual activity: Yes    Birth control/protection: Pill  Other Topics Concern   Not on file  Social History Narrative   Lives by herself ; most of family in [place], mother lives near by   Social Determinants of Health   Financial Resource Strain: Not on file  Food Insecurity: Not on file  Transportation Needs: Not on file  Physical Activity: Not on file  Stress: Not on file  Social Connections: Not on file    Review of Systems: A 12 point ROS discussed and pertinent positives are indicated in the HPI  above.  All other systems are negative.  Review of Systems  Genitourinary:  Positive for pelvic pain and vaginal bleeding.   Vital Signs: BP (!) 159/95   Pulse 96   Temp 98.6 F (37 C) (Oral)   Resp 18   Ht 5\' 6"  (1.676 m)   Wt 200 lb (90.7 kg)   LMP 12/05/2021 (Approximate) Comment: pt is currently bleeding  SpO2 100%   BMI 32.28 kg/m   Physical Exam Constitutional:      Appearance: Normal appearance.  HENT:     Head: Normocephalic and atraumatic.  Eyes:     Extraocular Movements: Extraocular movements intact.     Pupils: Pupils are equal, round, and reactive to light.  Cardiovascular:     Rate and Rhythm: Normal rate and regular rhythm.     Pulses: Normal pulses.     Heart sounds: Normal heart sounds.  Pulmonary:     Effort: Pulmonary effort is normal. No respiratory distress.     Breath sounds: Normal breath sounds.  Abdominal:     General: Abdomen is flat.     Palpations: Abdomen is soft.     Tenderness: There is no abdominal tenderness. There is no guarding.  Musculoskeletal:     Right lower leg: No edema.     Left lower leg: No edema.  Skin:    General: Skin is warm and dry.  Neurological:     Mental Status: She is alert and oriented to person, place, and time.  Psychiatric:        Mood and Affect: Mood normal.        Behavior: Behavior normal.        Thought Content: Thought content normal.        Judgment: Judgment normal.    Imaging: No results found.  Labs:  CBC: Recent Labs    06/11/21 1601 11/19/21 1132 12/26/21 0813  WBC 5.6 9.2 6.9  HGB 12.0 10.7* 11.1*  HCT 37.2 34.0* 35.1*  PLT 352.0 415.0* 421*    COAGS: No results for input(s): INR, APTT in the last 8760 hours.  BMP: Recent Labs    11/19/21 1132 12/26/21 0813  NA 137 135  K 4.5 3.5  CL 103 105  CO2 28 20*  GLUCOSE 83 93  BUN 10 17  CALCIUM 9.4 9.5  CREATININE 0.92 0.88  GFRNONAA  --  >60    LIVER FUNCTION TESTS: Recent Labs    11/19/21 1132  BILITOT 0.2  AST  14  ALT 17  ALKPHOS 58  PROT 7.0  ALBUMIN 4.2    TUMOR MARKERS: No results for input(s): AFPTM, CEA, CA199, CHROMGRNA in the last 8760 hours.  Assessment and Plan: History of anemia, menorrhagia, intramural fibroids, myomectomy, GERD and HTN. Pt consulted with Dr. Richarda Overlie, IR, for treatment of uterine fibroids with uterine artery emobolization. Pt decided at that time that she wanted to proceed with embolization procedure.    Pt resting on stretcher. She is A&O, calm and pleasant. She is in     no distress.   Pt is NPO per order.  Today's labs pending.   Risks and benefits of procedure were discussed with the patient including, but not limited to bleeding, infection, vascular injury or contrast induced renal failure.   This interventional procedure involves the use of X-rays and because of the nature of the planned procedure, it is possible that we will have prolonged use of X-ray fluoroscopy.   Potential radiation risks to you include (but are not limited to) the following: - A slightly elevated risk for cancer several years later in life. This risk is typically less than 0.5% percent. This risk is low in comparison to the normal incidence of human cancer, which is 33% for women and 50% for men according to the [employer] Society.  - Radiation induced injury can include skin redness, resembling a rash, tissue breakdown / ulcers and hair loss (which can be temporary or permanent).    The likelihood of either of these occurring depends on the difficulty of the procedure and whether you are sensitive to radiation due to previous procedures, disease, or genetic conditions.    IF your procedure requires a prolonged use of radiation, you will be notified and given written instructions for further action.  It is your responsibility to monitor the irradiated area for the 2 weeks following the procedure and to notify your physician if you are concerned that you have suffered a radiation  induced injury.     All of the patient's questions were answered, patient is agreeable to proceed.   Consent signed and in chart.   Thank you for this interesting consult.  I greatly enjoyed meeting Miranda [name] and look forward to participating in their care.  A copy of this report was sent to the requesting provider on this date.  Electronically Signed: Shon Hough, NP 12/26/2021, 8:44 AM   I spent a total of 20 minutes in face to face in clinical consultation, greater than 50% of which was counseling/coordinating care for pelvic arteriogram with possible uterine artery embolization.

## 2021-12-26 ENCOUNTER — Ambulatory Visit (HOSPITAL_COMMUNITY)
Admission: RE | Admit: 2021-12-26 | Discharge: 2021-12-26 | Disposition: A | Discharge status: Home / Self Care | Payer: Managed Care, Other (non HMO) | Source: Ambulatory Visit | Attending: Diagnostic Radiology | Admitting: Diagnostic Radiology

## 2021-12-26 ENCOUNTER — Other Ambulatory Visit (HOSPITAL_COMMUNITY): Payer: Self-pay | Admitting: Diagnostic Radiology

## 2021-12-26 ENCOUNTER — Observation Stay (HOSPITAL_COMMUNITY)
Admission: RE | Admit: 2021-12-26 | Discharge: 2021-12-27 | Disposition: A | Discharge status: Home / Self Care | Payer: Managed Care, Other (non HMO) | Source: Ambulatory Visit | Attending: Diagnostic Radiology | Admitting: Diagnostic Radiology

## 2021-12-26 ENCOUNTER — Encounter (HOSPITAL_COMMUNITY): Payer: Self-pay

## 2021-12-26 DIAGNOSIS — D251 Intramural leiomyoma of uterus: Secondary | ICD-10-CM | POA: Diagnosis not present

## 2021-12-26 DIAGNOSIS — Z79899 Other long term (current) drug therapy: Secondary | ICD-10-CM | POA: Insufficient documentation

## 2021-12-26 DIAGNOSIS — N920 Excessive and frequent menstruation with regular cycle: Secondary | ICD-10-CM | POA: Insufficient documentation

## 2021-12-26 DIAGNOSIS — K219 Gastro-esophageal reflux disease without esophagitis: Secondary | ICD-10-CM | POA: Diagnosis not present

## 2021-12-26 DIAGNOSIS — D259 Leiomyoma of uterus, unspecified: Secondary | ICD-10-CM

## 2021-12-26 DIAGNOSIS — I10 Essential (primary) hypertension: Secondary | ICD-10-CM | POA: Diagnosis not present

## 2021-12-26 HISTORY — PX: IR EMBO TUMOR ORGAN ISCHEMIA INFARCT INC GUIDE ROADMAPPING: IMG5449

## 2021-12-26 HISTORY — PX: IR ANGIOGRAM SELECTIVE EACH ADDITIONAL VESSEL: IMG667

## 2021-12-26 HISTORY — PX: IR US GUIDE VASC ACCESS RIGHT: IMG2390

## 2021-12-26 HISTORY — PX: IR ANGIOGRAM PELVIS SELECTIVE OR SUPRASELECTIVE: IMG661

## 2021-12-26 LAB — CBC WITH DIFFERENTIAL/PLATELET
Abs Immature Granulocytes: 0.01 10*3/uL (ref 0.00–0.07)
Basophils Absolute: 0.1 10*3/uL (ref 0.0–0.1)
Basophils Relative: 1 %
Eosinophils Absolute: 0.1 10*3/uL (ref 0.0–0.5)
Eosinophils Relative: 1 %
HCT: 35.1 % — ABNORMAL LOW (ref 36.0–46.0)
Hemoglobin: 11.1 g/dL — ABNORMAL LOW (ref 12.0–15.0)
Immature Granulocytes: 0 %
Lymphocytes Relative: 23 %
Lymphs Abs: 1.6 10*3/uL (ref 0.7–4.0)
MCH: 26.8 pg (ref 26.0–34.0)
MCHC: 31.6 g/dL (ref 30.0–36.0)
MCV: 84.8 fL (ref 80.0–100.0)
Monocytes Absolute: 0.8 10*3/uL (ref 0.1–1.0)
Monocytes Relative: 11 %
Neutro Abs: 4.4 10*3/uL (ref 1.7–7.7)
Neutrophils Relative %: 64 %
Platelets: 421 10*3/uL — ABNORMAL HIGH (ref 150–400)
RBC: 4.14 MIL/uL (ref 3.87–5.11)
RDW: 17.6 % — ABNORMAL HIGH (ref 11.5–15.5)
WBC: 6.9 10*3/uL (ref 4.0–10.5)
nRBC: 0 % (ref 0.0–0.2)

## 2021-12-26 LAB — BASIC METABOLIC PANEL
Anion gap: 10 (ref 5–15)
BUN: 17 mg/dL (ref 6–20)
CO2: 20 mmol/L — ABNORMAL LOW (ref 22–32)
Calcium: 9.5 mg/dL (ref 8.9–10.3)
Chloride: 105 mmol/L (ref 98–111)
Creatinine, Ser: 0.88 mg/dL (ref 0.44–1.00)
GFR, Estimated: >60 mL/min (ref >60)
Glucose, Bld: 93 mg/dL (ref 70–99)
Potassium: 3.5 mmol/L (ref 3.5–5.1)
Sodium: 135 mmol/L (ref 135–145)

## 2021-12-26 LAB — PROTIME-INR
INR: 1 (ref 0.8–1.2)
Prothrombin Time: 13.2 seconds (ref 11.4–15.2)

## 2021-12-26 LAB — HCG, SERUM, QUALITATIVE: Preg, Serum: NEGATIVE

## 2021-12-26 MED ORDER — MIDAZOLAM HCL 2 MG/2ML IJ SOLN
INTRAMUSCULAR | Status: AC | PRN
  Administered 2021-12-26: 1 mg via INTRAVENOUS

## 2021-12-26 MED ORDER — KETOROLAC TROMETHAMINE 30 MG/ML IJ SOLN: 30.0000 mg | INTRAMUSCULAR | Status: AC

## 2021-12-26 MED ORDER — MIDAZOLAM HCL 2 MG/2ML IJ SOLN
INTRAMUSCULAR | Status: AC | PRN
Start: 2021-12-26 — End: 2021-12-26
  Administered 2021-12-26: 1 mg via INTRAVENOUS

## 2021-12-26 MED ORDER — CARVEDILOL 12.5 MG PO TABS
12.5000 mg | ORAL_TABLET | Freq: Two times a day (BID) | ORAL | Status: DC
  Administered 2021-12-26 – 2021-12-27 (×2): 12.5 mg via ORAL
  Filled 2021-12-26 (×2): qty 1

## 2021-12-26 MED ORDER — HYDROMORPHONE 1 MG/ML IV SOLN
INTRAVENOUS | Status: DC
  Administered 2021-12-26: 3.6 mg via INTRAVENOUS
  Administered 2021-12-26: 2.4 mg via INTRAVENOUS
  Filled 2021-12-26 (×11): qty 30

## 2021-12-26 MED ORDER — IOHEXOL 350 MG/ML SOLN
100.0000 mL | Freq: Once | INTRAVENOUS | Status: AC | PRN
  Administered 2021-12-26: 40 mL via INTRA_ARTERIAL

## 2021-12-26 MED ORDER — ONDANSETRON HCL 4 MG/2ML IJ SOLN
INTRAMUSCULAR | Status: AC | PRN
  Administered 2021-12-26: 4 mg via INTRAVENOUS

## 2021-12-26 MED ORDER — FERROUS GLUCONATE 324 (38 FE) MG PO TABS
324.0000 mg | ORAL_TABLET | Freq: Every day | ORAL | Status: DC
  Administered 2021-12-27: 324 mg via ORAL
  Filled 2021-12-26: qty 1

## 2021-12-26 MED ORDER — FENTANYL CITRATE (PF) 100 MCG/2ML IJ SOLN
INTRAMUSCULAR | Status: AC | PRN
  Administered 2021-12-26: 50 ug via INTRAVENOUS

## 2021-12-26 MED ORDER — IOHEXOL 300 MG/ML  SOLN
100.0000 mL | Freq: Once | INTRAMUSCULAR | Status: AC | PRN
  Administered 2021-12-26: 40 mL via INTRA_ARTERIAL

## 2021-12-26 MED ORDER — SODIUM CHLORIDE 0.9% FLUSH: 9.0000 mL | INTRAVENOUS | Status: DC | PRN

## 2021-12-26 MED ORDER — ONDANSETRON HCL 4 MG/2ML IJ SOLN
4.0000 mg | Freq: Four times a day (QID) | INTRAMUSCULAR | Status: DC | PRN
  Filled 2021-12-26: qty 2

## 2021-12-26 MED ORDER — SODIUM CHLORIDE 0.9% FLUSH
3.0000 mL | Freq: Two times a day (BID) | INTRAVENOUS | Status: DC
  Administered 2021-12-26 – 2021-12-27 (×2): 3 mL via INTRAVENOUS

## 2021-12-26 MED ORDER — KETOROLAC TROMETHAMINE 30 MG/ML IJ SOLN
INTRAMUSCULAR | Status: AC
  Filled 2021-12-26: qty 1

## 2021-12-26 MED ORDER — ONDANSETRON HCL 4 MG/2ML IJ SOLN
INTRAMUSCULAR | Status: AC
  Filled 2021-12-26: qty 2

## 2021-12-26 MED ORDER — PROMETHAZINE HCL 25 MG RE SUPP: 25.0000 mg | Freq: Three times a day (TID) | RECTAL | Status: DC | PRN

## 2021-12-26 MED ORDER — IOHEXOL 300 MG/ML  SOLN
100.0000 mL | Freq: Once | INTRAMUSCULAR | Status: AC | PRN
  Administered 2021-12-26: 30 mL via INTRA_ARTERIAL

## 2021-12-26 MED ORDER — PROMETHAZINE HCL 25 MG PO TABS
25.0000 mg | ORAL_TABLET | Freq: Three times a day (TID) | ORAL | Status: DC | PRN
  Administered 2021-12-26: 25 mg via ORAL
  Filled 2021-12-26: qty 1

## 2021-12-26 MED ORDER — DIPHENHYDRAMINE HCL 50 MG/ML IJ SOLN: 12.5000 mg | Freq: Four times a day (QID) | INTRAMUSCULAR | Status: DC | PRN

## 2021-12-26 MED ORDER — LIDOCAINE HCL 1 % IJ SOLN
INTRAMUSCULAR | Status: AC
  Filled 2021-12-26: qty 20

## 2021-12-26 MED ORDER — DIPHENHYDRAMINE HCL 12.5 MG/5ML PO ELIX
12.5000 mg | ORAL_SOLUTION | Freq: Four times a day (QID) | ORAL | Status: DC | PRN
  Administered 2021-12-26 – 2021-12-27 (×3): 12.5 mg via ORAL
  Filled 2021-12-26 (×4): qty 5

## 2021-12-26 MED ORDER — SODIUM CHLORIDE 0.9% FLUSH
3.0000 mL | INTRAVENOUS | Status: DC | PRN
Start: 2021-12-26 — End: 2021-12-27

## 2021-12-26 MED ORDER — NALOXONE HCL 0.4 MG/ML IJ SOLN: 0.4000 mg | INTRAMUSCULAR | Status: DC | PRN

## 2021-12-26 MED ORDER — FENTANYL CITRATE (PF) 100 MCG/2ML IJ SOLN
INTRAMUSCULAR | Status: AC
  Filled 2021-12-26: qty 4

## 2021-12-26 MED ORDER — MORPHINE SULFATE 1 MG/ML IV SOLN PCA
INTRAVENOUS | Status: DC
  Administered 2021-12-26: 6 mg via INTRAVENOUS
  Administered 2021-12-27: 9 mg via INTRAVENOUS
  Administered 2021-12-27: 16.41 mg via INTRAVENOUS
  Filled 2021-12-26: qty 30

## 2021-12-26 MED ORDER — MIDAZOLAM HCL 2 MG/2ML IJ SOLN
INTRAMUSCULAR | Status: AC
  Filled 2021-12-26: qty 6

## 2021-12-26 MED ORDER — CEFAZOLIN SODIUM-DEXTROSE 2-4 GM/100ML-% IV SOLN
INTRAVENOUS | Status: AC
  Filled 2021-12-26: qty 100

## 2021-12-26 MED ORDER — LIDOCAINE HCL (PF) 1 % IJ SOLN
INTRAMUSCULAR | Status: AC | PRN
  Administered 2021-12-26: 10 mL via INTRADERMAL

## 2021-12-26 MED ORDER — IOHEXOL 300 MG/ML  SOLN
40.0000 mL | Freq: Once | INTRAMUSCULAR | Status: AC | PRN
  Administered 2021-12-26: 40 mL via INTRA_ARTERIAL

## 2021-12-26 MED ORDER — CEFAZOLIN SODIUM-DEXTROSE 2-4 GM/100ML-% IV SOLN
2.0000 g | INTRAVENOUS | Status: AC
  Administered 2021-12-26: 2 g via INTRAVENOUS

## 2021-12-26 MED ORDER — KETOROLAC TROMETHAMINE 30 MG/ML IJ SOLN
INTRAMUSCULAR | Status: AC | PRN
  Administered 2021-12-26: 30 mg via INTRAVENOUS

## 2021-12-26 MED ORDER — KETOROLAC TROMETHAMINE 30 MG/ML IJ SOLN: 30.0000 mg | Freq: Four times a day (QID) | INTRAMUSCULAR | Status: DC

## 2021-12-26 MED ORDER — DOCUSATE SODIUM 100 MG PO CAPS
100.0000 mg | ORAL_CAPSULE | Freq: Two times a day (BID) | ORAL | Status: DC
  Administered 2021-12-26 – 2021-12-27 (×3): 100 mg via ORAL
  Filled 2021-12-26 (×3): qty 1

## 2021-12-26 MED ORDER — ONDANSETRON HCL 4 MG/2ML IJ SOLN
4.0000 mg | Freq: Four times a day (QID) | INTRAMUSCULAR | Status: DC | PRN
  Administered 2021-12-26 – 2021-12-27 (×3): 4 mg via INTRAVENOUS
  Filled 2021-12-26 (×2): qty 2

## 2021-12-26 MED ORDER — IOHEXOL 350 MG/ML SOLN
100.0000 mL | Freq: Once | INTRAVENOUS | Status: AC | PRN
  Administered 2021-12-26: 60 mL via INTRA_ARTERIAL

## 2021-12-26 MED ORDER — KETOROLAC TROMETHAMINE 30 MG/ML IJ SOLN
30.0000 mg | Freq: Four times a day (QID) | INTRAMUSCULAR | Status: DC
  Administered 2021-12-26 – 2021-12-27 (×4): 30 mg via INTRAVENOUS
  Filled 2021-12-26 (×4): qty 1

## 2021-12-26 MED ORDER — TRIAMTERENE-HCTZ 37.5-25 MG PO TABS
0.5000 | ORAL_TABLET | Freq: Every day | ORAL | Status: DC
  Administered 2021-12-27: 0.5 via ORAL
  Filled 2021-12-26: qty 0.5

## 2021-12-26 MED ORDER — SODIUM CHLORIDE 0.9 % IV SOLN: INTRAVENOUS | Status: DC

## 2021-12-26 MED ORDER — SODIUM CHLORIDE 0.9 % IV SOLN: 250.0000 mL | INTRAVENOUS | Status: DC | PRN

## 2021-12-26 NOTE — Sedation Documentation (Signed)
Patient brought to [facility] 4 via stretcher awaiting IR procedure room availability. Call bell in reach. ?

## 2021-12-26 NOTE — Procedures (Signed)
Interventional Radiology Procedure: ? ? ?Indications: Uterine fibroids with menorrhagia ? ?Procedure: Aortogram with pelvic angiography.  Right uterine artery embolization ? ?Findings: Solitary right uterine artery.  Right uterine artery embolization with Embospheres.  ? ?Complications: No immediate complications noted. ?    ?EBL: Minimal ? ?Plan: Bedrest 3 hours.  Overnight observation for symptom management.  ? ? ?Shahida Schnackenberg R. Anselm Pancoast, MD  ?Pager: [phone] ? ? ? ?  ?

## 2021-12-27 DIAGNOSIS — D251 Intramural leiomyoma of uterus: Secondary | ICD-10-CM | POA: Diagnosis not present

## 2021-12-27 MED ORDER — HYDROCODONE-ACETAMINOPHEN 5-325 MG PO TABS: 1.0000 | ORAL_TABLET | Freq: Four times a day (QID) | ORAL | Status: DC | PRN

## 2021-12-27 MED ORDER — ONDANSETRON HCL 4 MG/2ML IJ SOLN: 4.0000 mg | Freq: Four times a day (QID) | INTRAMUSCULAR | 0 refills | Status: DC | PRN

## 2021-12-27 MED ORDER — DOCUSATE SODIUM 100 MG PO CAPS: 100.0000 mg | ORAL_CAPSULE | Freq: Two times a day (BID) | ORAL | 0 refills | Status: AC

## 2021-12-27 MED ORDER — HYDROCODONE-ACETAMINOPHEN 5-325 MG PO TABS: 1.0000 | ORAL_TABLET | Freq: Four times a day (QID) | ORAL | 0 refills | Status: DC | PRN

## 2021-12-27 MED ORDER — ONDANSETRON HCL 4 MG PO TABS: 4.0000 mg | ORAL_TABLET | Freq: Three times a day (TID) | ORAL | 0 refills | Status: DC | PRN

## 2021-12-27 NOTE — Progress Notes (Signed)
Morphine PCA pump d/c'd. Wasted 41 mL's with charge nurse, Adline Peals, RN.  ?

## 2021-12-27 NOTE — Discharge Summary (Addendum)
? ?Patient ID: ?Miranda [name] ?MRN: [id number] ?DOB/AGE: 1975-12-20 46 y.o. ? ?Admit date: 12/26/2021 ?Discharge date: 12/27/2021 ? ?Supervising Physician: Markus Daft ? ?Patient Status: [facility] - In-pt ? ?Admission Diagnoses: Symptomatic uterine fibroids/menorrhagia ? 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  Menorrhagia ? ?Past Medical History:  ?Diagnosis Date  ? Acne   ? Allergic rhinitis   ? Allergy   ? Anemia   ? Fibroids, intramural 10/22/2011  ? GERD (gastroesophageal reflux disease)   ? past hx- occ prn Tums   ? HA (headache) 07/2008  ? MRI (-), MRA 2 anerurysms?------> CT angio (-); saw Neuro, likely migraines, Rx imitrex  ? Hemorrhoids   ? Hypertension   ? ?Past Surgical History:  ?Procedure Laterality Date  ? IR ANGIOGRAM PELVIS SELECTIVE OR SUPRASELECTIVE  12/26/2021  ? IR ANGIOGRAM SELECTIVE EACH ADDITIONAL VESSEL  12/26/2021  ? IR ANGIOGRAM SELECTIVE EACH ADDITIONAL VESSEL  12/26/2021  ? IR EMBO TUMOR ORGAN ISCHEMIA INFARCT INC GUIDE ROADMAPPING  12/26/2021  ? IR RADIOLOGIST EVAL & MGMT  05/15/2021  ? IR RADIOLOGIST EVAL & MGMT  07/09/2021  ? IR US GUIDE VASC ACCESS RIGHT  12/26/2021  ? LAPAROTOMY  10/23/2011  ? Myomectomy-- EXPLORATORY LAPAROTOMY;  Surgeon: Thornell Sartorius, MD;  Location: [facility];  Service: Gynecology;  Laterality: N/A;  thru incision (laparotomy)/Myomyectomy  ? WISDOM TOOTH EXTRACTION    ? ? ? ?Discharged Condition: good ? ?Hospital Course: Ms. Pink is a 46 year old female with history of symptomatic uterine fibroids who underwent consultation with Dr. Anselm Pancoast on 05/15/2021 to discuss treatment options. She was deemed an appropriate candidate for uterine artery embolization and presented to [facility] on 12/26/2021 for the procedure.  She underwent successful embolization of the right uterine artery with embospheres.  There was no significant left uterine artery and no significant arterial contribution to the uterus from the ovarian arteries.  Following the procedure she was admitted to the hospital for overnight observation for pain control.  She was placed on Dilaudid PCA pump but eventually switched to morphine secondary to nausea/vomiting.  She was given antiemetics as needed.  She did have expected post procedure pelvic cramping.  She was also noted to have some vaginal spotting which was present prior to the procedure.  On the morning of discharge she was stable.  Pelvic cramping was diminished but she did have some occasional nausea which resolved by midmorning.  She denied fever, headache, chest pain, dyspnea, cough, back pain.  She was able to void and ambulate as well as tolerate her diet.  She will resume her home medications.  Electronic prescriptions were sent in for Vicodin, Colace and Zofran.  She was also advised to take ibuprofen 600 mg orally every 6 hours for the next 5 days and taper to normal dosing afterwards.  She was encouraged to stay well-hydrated and avoid strenuous activity for the next several days.  Follow-up will be arranged with Dr. Anselm Pancoast in 2 weeks.  She will continue gynecologic care with Dr. Garwin Brothers as scheduled. ? ?Consults: None ? ?Significant Diagnostic Studies:  ?Results for orders placed or performed during the hospital encounter of 12/26/21  ?CBC with Differential/Platelet  ?Result Value Ref Range  ? WBC 6.9 4.0 - 10.5 K/uL  ? RBC 4.14 3.87 -  5.11 MIL/uL  ? Hemoglobin 11.1 (L) 12.0 - 15.0 g/dL  ? HCT 35.1 (L) 36.0 - 46.0 %  ? MCV 84.8 80.0 - 100.0 fL  ? MCH 26.8 26.0 - 34.0 pg  ? MCHC 31.6 30.0 - 36.0 g/dL  ? RDW 17.6 (H) 11.5 - 15.5 %  ? Platelets 421 (H) 150 - 400 K/uL  ? nRBC 0.0 0.0 - 0.2 %  ? Neutrophils Relative % 64 %  ? Neutro Abs 4.4 1.7 - 7.7 K/uL  ?  Lymphocytes Relative 23 %  ? Lymphs Abs 1.6 0.7 - 4.0 K/uL  ? Monocytes Relative 11 %  ? Monocytes Absolute 0.8 0.1 - 1.0 K/uL  ? Eosinophils Relative 1 %  ? Eosinophils Absolute 0.1 0.0 - 0.5 K/uL  ? Basophils Relative 1 %  ? Basophils Absolute 0.1 0.0 - 0.1 K/uL  ? Immature Granulocytes 0 %  ? Abs Immature Granulocytes 0.01 0.00 - 0.07 K/uL  ?Basic metabolic panel  ?Result Value Ref Range  ? Sodium 135 135 - 145 mmol/L  ? Potassium 3.5 3.5 - 5.1 mmol/L  ? Chloride 105 98 - 111 mmol/L  ? CO2 20 (L) 22 - 32 mmol/L  ? Glucose, Bld 93 70 - 99 mg/dL  ? BUN 17 6 - 20 mg/dL  ? Creatinine, Ser 0.88 0.44 - 1.00 mg/dL  ? Calcium 9.5 8.9 - 10.3 mg/dL  ? GFR, Estimated >60 >60 mL/min  ? Anion gap 10 5 - 15  ?hCG, serum, qualitative  ?Result Value Ref Range  ? Preg, Serum NEGATIVE NEGATIVE  ?Protime-INR  ?Result Value Ref Range  ? Prothrombin Time 13.2 11.4 - 15.2 seconds  ? INR 1.0 0.8 - 1.2  ? ? ? ?Treatments: 1. 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 There is no significant left uterine artery and there is no ?significant arterial contribution to the uterus from the ovarian ?arteries. ?2. Successful embolization of the right uterine artery with ?Embospheres  12/26/21 ? ?Discharge Exam: ?Blood pressure 136/79, pulse 76, temperature 97.9 ?F (36.6 ?C), temperature source Oral, resp. rate 17, height '5\' 6"'$  (1.676 m), weight 200 lb (90.7 kg), last menstrual period 12/05/2021, SpO2 100 %. ?Awake, alert.  Chest clear to auscultation bilaterally.  Heart with regular rate and rhythm.  Abdomen soft, positive bowel sounds, currently nontender.  Puncture site right common femoral artery with intact bandage, some old blood noted on periphery; some mild tenderness to palpation, area is soft to touch; no lower extremity edema, intact distal pulses ? ?Disposition: Discharge disposition: 01-Home or Self Care ? ? ? ? ? ? ?Discharge Instructions   ? ? Call MD for:  difficulty breathing, headache or visual  disturbances   Complete by: As directed ?  ? Call MD for:  extreme fatigue   Complete by: As directed ?  ? Call MD for:  hives   Complete by: As directed ?  ? Call MD for:  persistant dizziness or light-headedness   Complete by: As directed ?  ? Call MD for:  persistant nausea and vomiting   Complete by: As directed ?  ? Call MD for:  redness, tenderness, or signs of infection (pain, swelling, redness, odor or green/yellow discharge around incision site)   Complete by: As directed ?  ? Call MD for:  severe uncontrolled pain   Complete by: As directed ?  ? Call MD for:  temperature >100.4   Complete by: As directed ?  ? Change dressing (specify)  Complete by: As directed ?  ? May change bandage over right groin and apply Band-Aid to site daily for the next 2 to 3 days.  May wash site with soap and water.  ? Diet - low sodium heart healthy   Complete by: As directed ?  ? Discharge instructions   Complete by: As directed ?  ? Stay well-hydrated, hold strenuous activity for 3 to 4 days, resume home medications; use Advil initially for any cramping-600 mg every 6 hours for the first 5 days then resume normal dosing  ? Driving Restrictions   Complete by: As directed ?  ? No driving for the next 24 hours or after taking narcotic medication  ? Increase activity slowly   Complete by: As directed ?  ? Lifting restrictions   Complete by: As directed ?  ? Avoid strenuous activity for the next 3 to 4 days  ? Sexual Activity Restrictions   Complete by: As directed ?  ? No sexual intercourse for 1 week  ? ?  ? ?Allergies as of 12/27/2021   ? ?   Reactions  ? Beef-derived Products Other (See Comments)  ? Pt does not eat beef  ? Pork-derived Products Other (See Comments)  ? Pt does not eat pork   ? ?  ? ?  ?Medication List  ?  ? ?STOP taking these medications   ? ?Iron 90 (18 Fe) MG Tabs ?  ? ?  ? ?TAKE these medications   ? ?ALLEGRA PO ?Take 1 tablet by mouth every morning. ?  ?carvedilol 12.5 MG tablet ?Commonly known as:  COREG ?TAKE 1 TABLET (12.5 MG TOTAL) BY MOUTH 2 (TWO) TIMES DAILY WITH A MEAL. ?  ?Dapsone 5 % topical gel ?Apply 1 application. topically daily as needed (facial acne). ?  ?docusate sodium 100 MG capsule ?Commonly known

## 2021-12-27 NOTE — Discharge Instructions (Signed)
Stay well-hydrated, avoid strenuous activity for the next 3 to 4 days, may resume home medications; please take Advil 600 mg by mouth every 6 hours for the next 5 days then resume normal dosing afterwards ?

## 2022-01-14 ENCOUNTER — Ambulatory Visit
Admission: RE | Admit: 2022-01-14 | Discharge: 2022-01-14 | Disposition: A | Discharge status: Home / Self Care | Payer: Managed Care, Other (non HMO) | Source: Ambulatory Visit | Attending: Radiology | Admitting: Radiology

## 2022-01-14 ENCOUNTER — Encounter: Payer: Self-pay | Admitting: *Deleted

## 2022-01-14 DIAGNOSIS — D259 Leiomyoma of uterus, unspecified: Secondary | ICD-10-CM

## 2022-01-14 HISTORY — PX: IR RADIOLOGIST EVAL & MGMT: IMG5224

## 2022-01-14 NOTE — Progress Notes (Signed)
? ? ?Chief Complaint: ?Patient was consulted remotely today (TeleHealth) for follow-up uterine artery embolization. ? ?Referring Physician(s): ?Servando Salina ? ?History of Present Illness: ?Miranda [name] is a 46 y.o. female with a uterine fibroids and menorrhagia.  Patient underwent uterine artery embolization procedure on 12/26/2021.  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  There was no significant arterial flow to the fibroids from ovarian arteries.  Patient was in the hospital overnight for observation and was discharged on 12/27/2021.  The post procedure recovery has been uneventful.  Patient took pain medicines for approximately 5 days after the procedure.  She had nausea for 1 day that was controlled with medications.  She notes a small amount of brown vaginal discharge.  Her regular menstrual period was supposed to start a few days after the procedure but it did not happen.  She has not had a menstrual period since the procedure.  She had bruising in the right groin which she says is getting better.  She denies any fullness or swelling in the right groin.  She says that her energy level is better than it has been in a long time.  She is back to work and ready to start exercising again. ? ?Past Medical History:  ?Diagnosis Date  ? Acne   ? Allergic rhinitis   ? Allergy   ? Anemia   ? Fibroids, intramural 10/22/2011  ? GERD (gastroesophageal reflux disease)   ? past hx- occ prn Tums   ? HA (headache) 07/2008  ? MRI (-), MRA 2 anerurysms?------> CT angio (-); saw Neuro, likely migraines, Rx imitrex  ? Hemorrhoids   ? Hypertension   ? ? ?Past Surgical History:  ?Procedure Laterality Date  ? IR ANGIOGRAM PELVIS SELECTIVE OR SUPRASELECTIVE  12/26/2021  ? IR ANGIOGRAM SELECTIVE EACH ADDITIONAL VESSEL  12/26/2021  ? IR ANGIOGRAM SELECTIVE EACH ADDITIONAL VESSEL  12/26/2021  ? IR EMBO TUMOR ORGAN ISCHEMIA INFARCT INC GUIDE  ROADMAPPING  12/26/2021  ? IR RADIOLOGIST EVAL & MGMT  05/15/2021  ? IR RADIOLOGIST EVAL & MGMT  07/09/2021  ? IR US GUIDE VASC ACCESS RIGHT  12/26/2021  ? LAPAROTOMY  10/23/2011  ? Myomectomy-- EXPLORATORY LAPAROTOMY;  Surgeon: Thornell Sartorius, MD;  Location: [facility];  Service: Gynecology;  Laterality: N/A;  thru incision (laparotomy)/Myomyectomy  ? WISDOM TOOTH EXTRACTION    ? ? ?Allergies: ?Beef-derived products and Pork-derived products ? ?Medications: ?Prior to Admission medications   ?Medication Sig Start Date End Date Taking? Authorizing Provider  ?carvedilol (COREG) 12.5 MG tablet TAKE 1 TABLET (12.5 MG TOTAL) BY MOUTH 2 (TWO) TIMES DAILY WITH A MEAL. 09/09/21   Colon Branch, MD  ?Dapsone 5 % topical gel Apply 1 application. topically daily as needed (facial acne).    [provider]  ?docusate sodium (COLACE) 100 MG capsule Take 1 capsule (100 mg total) by mouth 2 (two) times daily. 12/27/21   Allred, Shirlyn Goltz, PA-C  ?Fexofenadine HCl (ALLEGRA PO) Take 1 tablet by mouth every morning.    [provider]  ?fluticasone (FLONASE) 50 MCG/ACT nasal spray Place 2 sprays into both nostrils every morning.    [provider]  ?HYDROcodone-acetaminophen (NORCO/VICODIN) 5-325 MG tablet Take 1-2 tablets by mouth every 6 (six) hours as needed for moderate pain. 12/27/21   Allred, Darrell K, PA-C  ?ibuprofen (ADVIL) 200 MG tablet Take 400 mg by mouth every 6 (six) hours as needed (pain).    [provider]  ?iron polysaccharides (NIFEREX) 150 MG capsule Take 150 mg by mouth every morning.    [provider]  ?Levocetirizine Dihydrochloride (XYZAL PO) Take 1 tablet by mouth at bedtime.    [provider]  ?megestrol (MEGACE) 40 MG tablet Take 40 mg by mouth every evening. 11/20/21   [provider]  ?ondansetron (ZOFRAN) 4 MG tablet Take 1 tablet (4 mg total) by mouth every 8 (eight) hours as needed for nausea or vomiting. 12/27/21   Allred, Darrell K, PA-C   ?triamterene-hydrochlorothiazide (MAXZIDE-25) 37.5-25 MG tablet Take 0.5 tablets by mouth daily. 11/21/21   Colon Branch, MD  ?vitamin C (ASCORBIC ACID) 500 MG tablet Take 500 mg by mouth every morning.    [provider]  ?  ? ?Family History  ?Problem Relation Age of Onset  ? Diabetes Other   ?     uncle  ? Hypertension Mother   ?     M, aunt, GM  ? Leukemia Other   ?     cousin  ? Breast cancer Other   ?     aunt maternal   ? Cancer Maternal Aunt   ?     Lung Cancer  ? Colon polyps Maternal Uncle   ? CAD Maternal Grandmother   ? Colon cancer Neg Hx   ? Allergic rhinitis Neg Hx   ? Angioedema Neg Hx   ? Asthma Neg Hx   ? Atopy Neg Hx   ? Eczema Neg Hx   ? Immunodeficiency Neg Hx   ? Urticaria Neg Hx   ? Esophageal cancer Neg Hx   ? Rectal cancer Neg Hx   ? Stomach cancer Neg Hx   ? ? ?Social History  ? ?Socioeconomic History  ? Marital status: Single  ?  Spouse name: Not on file  ? Number of children: 0  ? Years of education: Not on file  ? Highest education level: Not on file  ?Occupational History  ? 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 Smoking status: Never  ? Smokeless tobacco: Never  ?Vaping Use  ? Vaping Use: Never used  ?Substance and Sexual Activity  ? Alcohol use: Yes  ?  Comment: socially  ? Drug use: No  ? Sexual activity: Yes  ?  Birth control/protection: Pill  ?Other Topics Concern  ? Not on file  ?Social History Narrative  ? Lives by herself ; most of family in [place], mother lives near by  ? ?Social Determinants of Health  ? ?Financial Resource Strain: Not on file  ?Food Insecurity: Not on file  ?Transportation Needs: Not on file  ?Physical Activity: Not on file  ?Stress: Not on file  ?Social Connections: Not on file  ? ? ?Review of Systems  ?Constitutional:  Negative for chills and fever.  ?Gastrointestinal:  Negative for abdominal pain.  ?Genitourinary:  Positive for vaginal discharge.  ?Musculoskeletal:   ?     Right groin bruising  ? ? ? ?Physical Exam ?No direct physical exam  was performed  ? ?Vital Signs: ?There were no vitals taken for this visit. ? ?Imaging: ?IR Angiogram Pelvis Selective Or Supraselective ? ?Result Date: 12/26/2021 ?INDICATION: 46 year old with uterine fibroids and menorrhagia. Plan for uterine artery embolization procedure. EXAM: 1. Aortogram 2. Bilateral pelvic angiography 3. Selective angiography of the right uterine artery 4. Particle embolization of the right uterine artery 5. Ultrasound guidance for vascular access MEDICATIONS: Ancef 2 g, 4 mg Zofran, 30 mg  Toradol ANESTHESIA/SEDATION: Moderate (conscious) sedation was employed during this procedure. A total of Versed 6.'0mg'$  and fentanyl 200 mcg was administered intravenously at the order of the provider performing the procedure. Total intra-service moderate sedation time: 1 hour and 59 minutes. Patient's level of consciousness and vital signs were monitored continuously by radiology nurse throughout the procedure under the supervision of the provider performing the procedure. CONTRAST:  75m OMNIPAQUE IOHEXOL 300 MG/ML SOLN, 47mOMNIPAQUE IOHEXOL 350 MG/ML SOLN, 609mMNIPAQUE IOHEXOL 350 MG/ML SOLN, 83m50mNIPAQUE IOHEXOL 300 MG/ML SOLN, 30mL84mIPAQUE IOHEXOL 300 MG/ML SOLN, 83mL 55mPAQUE IOHEXOL 300 MG/ML SOLN FLUOROSCOPY: Radiation Exposure Index (as provided by the fluoroscopic device): [id number] COMPLICATIONS: None immediate. PROCEDURE: Informed consent was obtained from the patient following explanation of the procedure, risks, benefits and alternatives. The patient understands, agrees and consents for the procedure. All questions were addressed. A time out was performed prior to the initiation of the procedure. The patient was placed supine on the interventional table. The right groin was prepped and draped in a sterile fashion. Maximal barrier sterile technique was utilized including caps, mask, sterile gowns, sterile gloves, sterile drape, hand hygiene and skin antiseptic. Ultrasound confirmed a  patent right common femoral artery. Ultrasound image was saved for documentation. The skin was anesthetized 1% lidocaine. Using ultrasound guidance, 21 gauge needle was directed in the right common femoral artery and a microp

## 2022-01-24 ENCOUNTER — Other Ambulatory Visit: Payer: Managed Care, Other (non HMO)

## 2022-02-19 ENCOUNTER — Other Ambulatory Visit: Payer: Self-pay | Admitting: Internal Medicine

## 2022-05-20 ENCOUNTER — Other Ambulatory Visit: Payer: Self-pay | Admitting: Internal Medicine

## 2022-06-10 ENCOUNTER — Other Ambulatory Visit: Payer: Self-pay | Admitting: Diagnostic Radiology

## 2022-06-10 ENCOUNTER — Other Ambulatory Visit: Payer: Self-pay | Admitting: Obstetrics and Gynecology

## 2022-06-10 DIAGNOSIS — D259 Leiomyoma of uterus, unspecified: Secondary | ICD-10-CM

## 2022-07-15 ENCOUNTER — Ambulatory Visit
Admission: RE | Admit: 2022-07-15 | Discharge: 2022-07-15 | Disposition: A | Discharge status: Home / Self Care | Payer: Managed Care, Other (non HMO) | Source: Ambulatory Visit | Attending: Obstetrics and Gynecology | Admitting: Obstetrics and Gynecology

## 2022-07-15 DIAGNOSIS — D259 Leiomyoma of uterus, unspecified: Secondary | ICD-10-CM

## 2022-07-15 MED ORDER — GADOPICLENOL 0.5 MMOL/ML IV SOLN
10.0000 mL | Freq: Once | INTRAVENOUS | Status: AC | PRN
  Administered 2022-07-15: 10 mL via INTRAVENOUS

## 2022-07-22 ENCOUNTER — Ambulatory Visit
Admission: RE | Admit: 2022-07-22 | Discharge: 2022-07-22 | Disposition: A | Discharge status: Home / Self Care | Payer: Managed Care, Other (non HMO) | Source: Ambulatory Visit | Attending: Diagnostic Radiology | Admitting: Diagnostic Radiology

## 2022-07-22 ENCOUNTER — Encounter: Payer: Self-pay | Admitting: Lab

## 2022-07-22 DIAGNOSIS — D259 Leiomyoma of uterus, unspecified: Secondary | ICD-10-CM

## 2022-07-22 HISTORY — PX: IR RADIOLOGIST EVAL & MGMT: IMG5224

## 2022-07-22 NOTE — Progress Notes (Signed)
Chief Complaint: Patient was consulted remotely today (TeleHealth) for uterine artery embolization follow up.  Referring Physician(s): Cousins, Sheronette  History of Present Illness: Miranda [name] is a 46 y.o. female with uterine fibroids and history of menorrhagia.  She underwent uterine artery embolization procedure on 12/26/2021.  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  Patient has done very well following the embolization.  Overall, she is very happy with the results.  Her menstrual bleeding is significantly lighter.  She did not have a period for 2 months following the procedure but now the periods are regular lasting 3 to 5 days without heavy bleeding.  She continues to have some cramping prior to her menstrual bleeding but the cramping has significantly decreased.  She feels decreased fullness in her abdomen and says that she cannot feel her fibroids anymore.  She has cut down on her iron supplements due to the decrease bleeding.  She does not complain of urinary or bowel symptoms.  She had some vaginal discharge immediately following the procedure but that has resolved.  She is back to her normal activities.  She does complain of vaginal dryness.  She had follow-up pelvic MRI on 07/05/2022.  Past Medical History:  Diagnosis Date   Acne    Allergic rhinitis    Allergy    Anemia    Fibroids, intramural 10/22/2011   GERD (gastroesophageal reflux disease)    past hx- occ prn Tums    HA (headache) 07/2008   MRI (-), MRA 2 anerurysms?------> CT angio (-); saw Neuro, likely migraines, Rx imitrex   Hemorrhoids    Hypertension     Past Surgical History:  Procedure Laterality Date   IR ANGIOGRAM PELVIS SELECTIVE OR SUPRASELECTIVE  12/26/2021   IR ANGIOGRAM SELECTIVE EACH ADDITIONAL VESSEL  12/26/2021   IR ANGIOGRAM SELECTIVE EACH ADDITIONAL VESSEL  12/26/2021   IR EMBO TUMOR  ORGAN ISCHEMIA INFARCT INC GUIDE ROADMAPPING  12/26/2021   IR RADIOLOGIST EVAL & MGMT  05/15/2021   IR RADIOLOGIST EVAL & MGMT  07/09/2021   IR RADIOLOGIST EVAL & MGMT  01/14/2022   IR US GUIDE VASC ACCESS RIGHT  12/26/2021   LAPAROTOMY  10/23/2011   Myomectomy-- EXPLORATORY LAPAROTOMY;  Surgeon: Thornell Sartorius, MD;  Location: [facility];  Service: Gynecology;  Laterality: N/A;  thru incision (laparotomy)/Myomyectomy   WISDOM TOOTH EXTRACTION      Allergies: Hydromorphone, Beef-derived products, and Pork-derived products  Medications: Prior to Admission medications   Medication Sig Start Date End Date Taking? Authorizing Provider  carvedilol (COREG) 12.5 MG tablet TAKE 1 TABLET (12.'5MG'$  TOTAL) BY MOUTH TWICE A DAY WITH MEALS 02/19/22   Colon Branch, MD  Dapsone 5 % topical gel Apply 1 application. topically daily as needed (facial acne).    [provider]  docusate sodium (COLACE) 100 MG capsule Take 1 capsule (100 mg total) by mouth 2 (two) times daily. 12/27/21   Allred, Darrell K, PA-C  Fexofenadine HCl (ALLEGRA PO) Take 1 tablet by mouth every morning.    [provider]  fluticasone (FLONASE) 50 MCG/ACT nasal spray Place 2 sprays into both nostrils every morning.    [provider]  HYDROcodone-acetaminophen (NORCO/VICODIN) 5-325 MG tablet Take 1-2 tablets by mouth every 6 (six) hours as needed for moderate pain. 12/27/21   Allred, Darrell K, PA-C  ibuprofen (ADVIL) 200 MG tablet Take 400 mg by mouth every 6 (  six) hours as needed (pain).    [provider]  iron polysaccharides (NIFEREX) 150 MG capsule Take 150 mg by mouth every morning.    [provider]  Levocetirizine Dihydrochloride (XYZAL PO) Take 1 tablet by mouth at bedtime.    [provider]  megestrol (MEGACE) 40 MG tablet Take 40 mg by mouth every evening. 11/20/21   [provider]  ondansetron (ZOFRAN) 4 MG tablet Take 1 tablet (4 mg total) by mouth every 8 (eight) hours as  needed for nausea or vomiting. 12/27/21   Allred, Darrell K, PA-C  triamterene-hydrochlorothiazide (MAXZIDE-25) 37.5-25 MG tablet TAKE 1/2 TABLET BY MOUTH EVERY DAY 05/20/22   Dutch Quint B, FNP  vitamin C (ASCORBIC ACID) 500 MG tablet Take 500 mg by mouth every morning.    [provider]     Family History  Problem Relation Age of Onset   Diabetes Other        uncle   Hypertension Mother        M, aunt, GM   Leukemia Other        cousin   Breast cancer Other        aunt maternal    Cancer Maternal Aunt        Lung Cancer   Colon polyps Maternal Uncle    CAD Maternal Grandmother    Colon cancer Neg Hx    Allergic rhinitis Neg Hx    Angioedema Neg Hx    Asthma Neg Hx    Atopy Neg Hx    Eczema Neg Hx    Immunodeficiency Neg Hx    Urticaria Neg Hx    Esophageal cancer Neg Hx    Rectal cancer Neg Hx    Stomach cancer Neg Hx     Social History   Socioeconomic History   Marital status: Single    Spouse name: Not on file   Number of children: 0   Years of education: Not on file   Highest education level: Not on file  Occupational History   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   Tobacco Use   Smoking status: Never   Smokeless tobacco: Never  Vaping Use   Vaping Use: Never used  Substance and Sexual Activity   Alcohol use: Yes    Comment: socially   Drug use: No   Sexual activity: Yes    Birth control/protection: Pill  Other Topics Concern   Not on file  Social History Narrative   Lives by herself ; most of family in [place], mother lives near by   Social Determinants of Health   Financial Resource Strain: Not on file  Food Insecurity: Not on file  Transportation Needs: Not on file  Physical Activity: Not on file  Stress: Not on file  Social Connections: Not on file     Review of Systems  Constitutional: Negative.  Negative for activity change.  Gastrointestinal:  Negative for abdominal distention and abdominal pain.  Genitourinary:  Positive for  menstrual problem.      Physical Exam No direct physical exam was performed   Vital Signs: There were no vitals taken for this visit.  Imaging: MR PELVIS W WO CONTRAST  Result Date: 07/16/2022 CLINICAL DATA:  Follow-up uterine fibroids. Approximately 6 months status post uterine artery embolization. EXAM: MRI PELVIS WITHOUT AND WITH CONTRAST TECHNIQUE: Multiplanar multisequence MR imaging of the pelvis was performed both before and after administration of intravenous contrast. CONTRAST:  10 mL Vueway COMPARISON:  06/07/2021 FINDINGS: Lower  Urinary Tract: No bladder or urethral abnormality identified. Bowel:  Unremarkable visualized pelvic bowel loops. Vascular/Lymphatic: No pathologically enlarged lymph nodes or other significant abnormality. Reproductive: -- Uterus: Measures 17.4 x 11.4 by 11.1 cm (volume = 1150 cm^3). This has decreased from 1350 cm3 on prior exam. Diffuse uterine involvement by fibroids is again seen, majority of which show mild decrease in size compared to previous study. Index fibroid in the central upper uterine corpus now measures 6.5 by 4.3 cm compared to 8.4 x 5.6 cm previously, and also shows diffuse hemorrhagic degeneration. No new or enlarging uterine masses are identified. -- Fibroid contrast enhancement: Majority of uterine fibroids now show lack of contrast enhancement. Three fibroids are seen in the anterior and posterior lower uterine segment which show residual contrast enhancement, largest in the posterior uterine segment measuring 3.6 x 3.1 cm. -- Right ovary:  Appears normal.  No mass identified. -- Left ovary:  Appears normal.  No mass identified. Other: No abnormal free fluid. Musculoskeletal:  Unremarkable. IMPRESSION: Mild decrease in overall uterine volume and size of individual fibroids, as described above. No new or enlarging uterine masses identified. Majority of fibroids show lack of contrast enhancement, however 3 fibroids are noted in the lower uterine  segment which demonstrate persistent enhancement, largest measuring 3.6 cm. Normal appearance of both ovaries. No adnexal mass identified. Electronically Signed   By: Marlaine Hind M.D.   On: 07/16/2022 15:22    Labs:  CBC: Recent Labs    11/19/21 1132 12/26/21 0813  WBC 9.2 6.9  HGB 10.7* 11.1*  HCT 34.0* 35.1*  PLT 415.0* 421*    COAGS: Recent Labs    12/26/21 0837  INR 1.0    BMP: Recent Labs    11/19/21 1132 12/26/21 0813  NA 137 135  K 4.5 3.5  CL 103 105  CO2 28 20*  GLUCOSE 83 93  BUN 10 17  CALCIUM 9.4 9.5  CREATININE 0.92 0.88  GFRNONAA  --  >60    LIVER FUNCTION TESTS: Recent Labs    11/19/21 1132  BILITOT 0.2  AST 14  ALT 17  ALKPHOS 58  PROT 7.0  ALBUMIN 4.2    TUMOR MARKERS: No results for input(s): "AFPTM", "CEA", "CA199", "CHROMGRNA" in the last 8760 hours.  Assessment and Plan:  46 year old with uterine fibroids and history of menorrhagia.  Patient has done very well following the right uterine artery embolization.  Her menstrual bleeding has significantly decreased and she has been able to decrease her iron supplements.  Overall, she is very happy with the results.  I reviewed the patient's recent MRI results and the largest fibroids demonstrates interval infarction.  The dominant submucosal fibroid has evidence of hemorrhagic degeneration and measures 6.5 x 4.3 cm, previously measured 8.4 x 5.6 cm.  We were concerned about this fibroid prior to the embolization because it could cause prolonged vaginal discharge after embolization.  Fortunately, the vaginal discharge has resolved.  There are residual enhancing fibroids in the lower uterine segment region, largest measuring 3.6 cm.  I suspect that these fibroids are getting flow from ovarian arterial supply that was not hypertrophied at the time of the procedure.  I would not treat these residual enhancing fibroids unless she has recurrent symptoms.  Based on the patient's age, I am hoping that  these residual fibroids will not be an issue for her.  However, if she does have recurrent menorrhagia, we could consider a repeat angiogram and embolization.  Patient does complain of  vaginal dryness and I suggested that she mention this to her gynecologist.  Patient will follow-up with Interventional Radiology as needed.      Electronically Signed: Burman Riis 07/22/2022, 9:13 AM   I spent a total of    10 Minutes in remote  clinical consultation, greater than 50% of which was counseling/coordinating care for uterine artery embolization follow-up.    Visit type: Audio only (telephone). Audio (no video) only due to patient preference and technical mutations. Alternative for in-person consultation at [facility], [address], [place], [place]. This visit type was conducted due to national recommendations for restrictions regarding the COVID-19 Pandemic (e.g. social distancing).  This format is felt to be most appropriate for this patient at this time.  All issues noted in this document were discussed and addressed.   Patient ID: Miranda [name], female   DOB: Dec 07, 1975, 46 y.o.   MRN: [id number]

## 2022-10-16 ENCOUNTER — Encounter: Payer: Self-pay | Admitting: Internal Medicine

## 2022-11-10 ENCOUNTER — Encounter: Payer: Self-pay | Admitting: Internal Medicine

## 2022-11-10 MED ORDER — BENZONATATE 200 MG PO CAPS: 200.0000 mg | ORAL_CAPSULE | Freq: Three times a day (TID) | ORAL | 0 refills | Status: DC | PRN

## 2022-11-11 ENCOUNTER — Encounter: Payer: Self-pay | Admitting: Family Medicine

## 2022-11-11 ENCOUNTER — Ambulatory Visit: Payer: Managed Care, Other (non HMO) | Admitting: Family Medicine

## 2022-11-11 VITALS — BP 135/79 | HR 69 | Temp 98.0°F | Resp 16 | Ht 66.0 in | Wt 205.0 lb

## 2022-11-11 DIAGNOSIS — Z1152 Encounter for screening for COVID-19: Secondary | ICD-10-CM

## 2022-11-11 DIAGNOSIS — R6889 Other general symptoms and signs: Secondary | ICD-10-CM | POA: Diagnosis not present

## 2022-11-11 DIAGNOSIS — U071 COVID-19: Secondary | ICD-10-CM | POA: Diagnosis not present

## 2022-11-11 DIAGNOSIS — J029 Acute pharyngitis, unspecified: Secondary | ICD-10-CM

## 2022-11-11 LAB — POCT INFLUENZA A/B
Influenza A, POC: NEGATIVE
Influenza B, POC: NEGATIVE

## 2022-11-11 LAB — POCT RAPID STREP A (OFFICE): Rapid Strep A Screen: NEGATIVE

## 2022-11-11 LAB — POC COVID19 BINAXNOW: SARS Coronavirus 2 Ag: POSITIVE — AB

## 2022-11-11 MED ORDER — NIRMATRELVIR/RITONAVIR (PAXLOVID)TABLET: 3.0000 | ORAL_TABLET | Freq: Two times a day (BID) | ORAL | 0 refills | Status: AC

## 2022-11-11 MED ORDER — PROMETHAZINE-DM 6.25-15 MG/5ML PO SYRP: 5.0000 mL | ORAL_SOLUTION | Freq: Three times a day (TID) | ORAL | 0 refills | Status: DC | PRN

## 2022-11-11 NOTE — Progress Notes (Signed)
Acute Office Visit  Subjective:     Patient ID: Miranda [name], female    DOB: Mar 09, 1976, 47 y.o.   MRN: [id number]  CC: URI   HPI   Upper Respiratory Infection: Patient complains of symptoms of a URI. Symptoms include congestion, cough, sore throat, and headache . Onset of symptoms was 4 days ago, gradually worsening since that time. She is drinking plenty of fluids. Evaluation to date: none. Treatment to date:  Mucinex, Flonase, Allegra, Coricidin, Tessalon, Aleve .     ROS All review of systems negative except what is listed in the HPI      Objective:    BP 135/79   Pulse 69   Temp 98 F (36.7 C)   Resp 16   Ht 5' 6"$  (1.676 m)   Wt 205 lb (93 kg)   SpO2 99%   BMI 33.09 kg/m    Physical Exam Vitals reviewed.  Constitutional:      Appearance: Normal appearance.  HENT:     Head: Normocephalic and atraumatic.     Right Ear: Tympanic membrane normal.     Left Ear: Tympanic membrane normal.     Nose: Congestion present.     Mouth/Throat:     Mouth: Mucous membranes are moist.     Pharynx: Oropharynx is clear. No oropharyngeal exudate or posterior oropharyngeal erythema.  Cardiovascular:     Rate and Rhythm: Normal rate and regular rhythm.     Pulses: Normal pulses.     Heart sounds: Normal heart sounds.  Pulmonary:     Effort: Pulmonary effort is normal.     Breath sounds: Normal breath sounds.  Musculoskeletal:     Cervical back: Normal range of motion and neck supple. No tenderness.  Lymphadenopathy:     Cervical: No cervical adenopathy.  Skin:    General: Skin is warm and dry.  Neurological:     Mental Status: She is alert and oriented to person, place, and time.  Psychiatric:        Mood and Affect: Mood normal.        Behavior: Behavior normal.        Thought Content: Thought content normal.        Judgment: Judgment normal.     Results for orders placed or performed in visit on 11/11/22  POC COVID-19  Result Value Ref Range   SARS  Coronavirus 2 Ag Positive (A) Negative  POCT rapid strep A  Result Value Ref Range   Rapid Strep A Screen Negative Negative        Assessment & Plan:   Problem List Items Addressed This Visit   None Visit Diagnoses     Sore throat    -  Primary   Relevant Orders   POCT rapid strep A (Completed)   Flu-like symptoms       Relevant Orders   POCT Influenza A/B   Encounter for screening for COVID-19       Relevant Orders   POC COVID-19 (Completed)   COVID-19       Relevant Medications   nirmatrelvir/ritonavir (PAXLOVID) 20 x 150 MG & 10 x 100MG TABS   promethazine-dextromethorphan (PROMETHAZINE-DM) 6.25-15 MG/5ML syrup       Start Paxlovid - information sheet attached Continue supportive measures including rest, hydration, humidifier use, steam showers, warm compresses to sinuses, warm liquids with lemon and honey, and over-the-counter cough, cold, and analgesics as needed.   Patient aware of signs/symptoms requiring further/urgent evaluation.  Meds ordered this encounter  Medications   nirmatrelvir/ritonavir (PAXLOVID) 20 x 150 MG & 10 x 100MG TABS    Sig: Take 3 tablets by mouth 2 (two) times daily for 5 days. (Take nirmatrelvir 150 mg two tablets twice daily for 5 days and ritonavir 100 mg one tablet twice daily for 5 days) Patient GFR is >60    Dispense:  30 tablet    Refill:  0    Order Specific Question:   Supervising Provider    Answer:   Penni Homans A [[id number]]   promethazine-dextromethorphan (PROMETHAZINE-DM) 6.25-15 MG/5ML syrup    Sig: Take 5 mLs by mouth 3 (three) times daily as needed for cough.    Dispense:  118 mL    Refill:  0    Order Specific Question:   Supervising Provider    Answer:   Penni Homans A [[id number]]    Return if symptoms worsen or fail to improve.  Terrilyn Saver, NP

## 2022-11-11 NOTE — Telephone Encounter (Signed)
Appt w/ Lovena Le today

## 2022-11-11 NOTE — Telephone Encounter (Signed)
Pt was needing a dr's note for work. She needs to know how long she has to be out. She stated this can be put in her mychart.

## 2022-11-11 NOTE — Telephone Encounter (Signed)
Initial Comment Caller states wants appt; telehealth dx w/upper respiratory; Dr Larose Kells sent meds yesterday but if not better to be seen; cough and sore throat; Translation No Nurse Assessment Nurse: Fredderick Phenix, RN, Lelan Pons Date/Time (Eastern Time): 11/11/2022 7:28:28 AM Confirm and document reason for call. If symptomatic, describe symptoms. ---Caller states she has a headcold, cough and sore throat since Friday. Afebrile. Has had 2 teledoc appts about this and isn't getting better. Does the patient have any new or worsening symptoms? ---Yes Will a triage be completed? ---Yes Related visit to physician within the last 2 weeks? ---Yes Does the PT have any chronic conditions? (i.e. diabetes, asthma, this includes High risk factors for pregnancy, etc.) ---Yes List chronic conditions. ---HTN Is the patient pregnant or possibly pregnant? (Ask all females between the ages of 58-55) ---No Is this a behavioral health or substance abuse call? ---No Guidelines Guideline Title Affirmed Question Affirmed Notes Nurse Date/Time Eilene Ghazi Time) Sore Throat SEVERE (e.g., excruciating) throat pain Fredderick Phenix, RN, Lelan Pons 11/11/2022 7:31:00 AM Disp. Time Eilene Ghazi Time) Disposition Final User 11/11/2022 7:32:58 AM See PCP within 24 Hours Yes Fredderick Phenix, RN, Karie Chimera NOTE: All timestamps contained within this report are represented as [place] Standard Time. CONFIDENTIALTY NOTICE: This fax transmission is intended only for the addressee. It contains information that is legally privileged, confidential or otherwise protected from use or disclosure. If you are not the intended recipient, you are strictly prohibited from reviewing, disclosing, copying using or disseminating any of this information or taking any action in reliance on or regarding this information. If you have received this fax in error, please notify us immediately by telephone so that we can arrange for its return to [place]. Phone: [phone], Toll-Free:  [phone], Fax: [phone] Page: 2 of 2 Call Id: [id number] Final Disposition 11/11/2022 7:32:58 AM See PCP within 24 Hours Yes Fredderick Phenix, RN, [place] Disagree/Comply Comply Caller Understands Yes PreDisposition Did not know what to do Care Advice Given Per Guideline SEE PCP WITHIN 24 HOURS: * IF OFFICE WILL BE OPEN: You need to be examined within the next 24 hours. Call your doctor (or NP/PA) when the office opens and make an appointment. CARE ADVICE given per Sore Throat (Adult) guideline. CALL BACK IF: * You become worse Referrals REFERRED TO PCP OFFICE

## 2022-11-11 NOTE — Patient Instructions (Addendum)
Start Paxlovid - information sheet attached Continue supportive measures including rest, hydration, humidifier use, steam showers, warm compresses to sinuses, warm liquids with lemon and honey, and over-the-counter cough, cold, and analgesics as needed.   Patient aware of signs/symptoms requiring further/urgent evaluation.    Over the counter medications that may be helpful for symptoms:  Guaifenesin 1200 mg extended release tabs twice daily, with plenty of water For cough and congestion Brand name: Mucinex   Pseudoephedrine 30 mg, one or two tabs every 4 to 6 hours For sinus congestion Brand name: Sudafed You must get this from the pharmacy counter.  Oxymetazoline nasal spray each morning, one spray in each nostril, for NO MORE THAN 3 days  For nasal and sinus congestion Brand name: Afrin Saline nasal spray or Saline Nasal Irrigation 3-5 times a day For nasal and sinus congestion Brand names: Ocean or AYR Fluticasone nasal spray, one spray in each nostril, each morning (after oxymetazoline and saline, if used) For nasal and sinus congestion Brand name: Flonase Warm salt water gargles  For sore throat Every few hours as needed Alternate ibuprofen 400-600 mg and acetaminophen 1000 mg every 4-6 hours For fever, body aches, headache Brand names: Motrin or Advil and Tylenol Dextromethorphan 12-hour cough version 30 mg every 12 hours  For cough Brand name: Delsym Stop all other cold medications for now (Nyquil, Dayquil, Tylenol Cold, Theraflu, etc) and other non-prescription cough/cold preparations. Many of these have the same ingredients listed above and could cause an overdose of medication.   Herbal treatments that have been shown to be helpful in some patients include: Vitamin C 1059m per day Vitamin D 4000iU per day Zinc 1061mper day Quercetin 25-50040mwice a day Melatonin 5-10mg at bedtime  General Instructions Allow your body to rest Drink PLENTY of fluids Isolate  yourself from everyone, even family, until test results have returned  If your COVID-19 test is positive Then you ARE INFECTED and you can pass the virus to others You must quarantine from others for a minimum of  5 days since symptoms started AND You are fever free for 24 hours WITHOUT any medication to reduce fever AND Your symptoms are improving Wear mask for the next 5 days If not improved by day 5, quarantine for the full 10 days. Do not go to the store or other public areas Do not go around household members who are not known to be infected with COVID-19 If you MUST leave your area of quarantine (example: go to a bathroom you share with others in your home), you must Wear a mask Wash your hands thoroughly Wipe down any surfaces you touch Do not share food, drinks, towels, or other items with other persons Dispose of your own tissues, food containers, etc  Once you have recovered, please continue good preventive care measures, including:  wearing a mask when in public wash your hands frequently avoid touching your face/nose/eyes cover coughs/sneezes with the inside of your elbow stay out of crowds keep a 6 foot distance from others  If you develop severe shortness of breath, uncontrolled fevers, coughing up blood, confusion, chest pain, or signs of dehydration (such as significantly decreased urine amounts or dizziness with standing) please go to the ER.

## 2022-11-17 ENCOUNTER — Other Ambulatory Visit: Payer: Self-pay | Admitting: Internal Medicine

## 2022-11-17 ENCOUNTER — Other Ambulatory Visit: Payer: Self-pay | Admitting: Family

## 2022-11-19 LAB — HM MAMMOGRAPHY

## 2022-11-21 ENCOUNTER — Encounter: Payer: Self-pay | Admitting: Internal Medicine

## 2022-11-21 MED ORDER — TRIAMTERENE-HCTZ 37.5-25 MG PO TABS: 0.5000 | ORAL_TABLET | Freq: Every day | ORAL | 1 refills | Status: DC

## 2022-12-08 ENCOUNTER — Encounter: Payer: Self-pay | Admitting: Internal Medicine

## 2022-12-08 ENCOUNTER — Ambulatory Visit (INDEPENDENT_AMBULATORY_CARE_PROVIDER_SITE_OTHER): Payer: Managed Care, Other (non HMO) | Admitting: Internal Medicine

## 2022-12-08 VITALS — BP 130/84 | HR 70 | Temp 97.9°F | Resp 16 | Ht 66.0 in | Wt 203.5 lb

## 2022-12-08 DIAGNOSIS — Z0000 Encounter for general adult medical examination without abnormal findings: Secondary | ICD-10-CM

## 2022-12-08 DIAGNOSIS — I10 Essential (primary) hypertension: Secondary | ICD-10-CM

## 2022-12-08 DIAGNOSIS — D649 Anemia, unspecified: Secondary | ICD-10-CM | POA: Diagnosis not present

## 2022-12-08 MED ORDER — FERROUS GLUCONATE 324 (38 FE) MG PO TABS: 324.0000 mg | ORAL_TABLET | Freq: Every day | ORAL | 12 refills | Status: AC

## 2022-12-08 NOTE — Patient Instructions (Addendum)
Check the  blood pressure regularly BP GOAL is between 110/65 and  135/85. If it is consistently higher or lower, let me know  I sent a prescription for iron supplement.  If possible take it on empty stomach.   GO TO THE LAB : Get the blood work     [place], [place] Come back for physical exam in 1 year.  Sooner if needed

## 2022-12-08 NOTE — Progress Notes (Unsigned)
Subjective:    Patient ID: Miranda [name], female    DOB: 24-Sep-1976, 47 y.o.   MRN: [id number]  DOS:  12/08/2022 Type of visit - description: CPX  Here for CPX In general feels well except for a case of COVID approximately 2 weeks ago, she is back to normal except for mild fatigue.   Review of Systems  Other than above, a 14 point review of systems is negative     Past Medical History:  Diagnosis Date   Acne    Allergic rhinitis    Allergy    Anemia    Fibroids, intramural 10/22/2011   GERD (gastroesophageal reflux disease)    past hx- occ prn Tums    HA (headache) 07/2008   MRI (-), MRA 2 anerurysms?------> CT angio (-); saw Neuro, likely migraines, Rx imitrex   Hemorrhoids    Hypertension     Past Surgical History:  Procedure Laterality Date   IR ANGIOGRAM PELVIS SELECTIVE OR SUPRASELECTIVE  12/26/2021   IR ANGIOGRAM SELECTIVE EACH ADDITIONAL VESSEL  12/26/2021   IR ANGIOGRAM SELECTIVE EACH ADDITIONAL VESSEL  12/26/2021   IR EMBO TUMOR ORGAN ISCHEMIA INFARCT INC GUIDE ROADMAPPING  12/26/2021   IR RADIOLOGIST EVAL & MGMT  05/15/2021   IR RADIOLOGIST EVAL & MGMT  07/09/2021   IR RADIOLOGIST EVAL & MGMT  01/14/2022   IR RADIOLOGIST EVAL & MGMT  07/22/2022   IR US GUIDE VASC ACCESS RIGHT  12/26/2021   LAPAROTOMY  10/23/2011   Myomectomy-- EXPLORATORY LAPAROTOMY;  Surgeon: Thornell Sartorius, MD;  Location: [facility];  Service: Gynecology;  Laterality: N/A;  thru incision (laparotomy)/Myomyectomy   WISDOM TOOTH EXTRACTION     Social History   Socioeconomic History   Marital status: Single    Spouse name: Not on file   Number of children: 0   Years of education: Not on file   Highest education level: Not on file  Occupational History   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   Tobacco Use   Smoking status: Never   Smokeless tobacco: Never  Vaping Use   Vaping Use: Never used  Substance and Sexual Activity   Alcohol use: Yes    Comment: socially   Drug use: No   Sexual activity: Yes   Other Topics Concern   Not on file  Social History Narrative   Lives by herself ; most of family in [place], mother lives near by   Social Determinants of Health   Financial Resource Strain: Not on file  Food Insecurity: Not on file  Transportation Needs: Not on file  Physical Activity: Not on file  Stress: Not on file  Social Connections: Not on file  Intimate Partner Violence: Not on file     Current Outpatient Medications  Medication Instructions   ascorbic acid (VITAMIN C) 500 mg, Oral, Every morning   carvedilol (COREG) 12.5 MG tablet TAKE 1 TABLET (12.'5MG'$  TOTAL) BY MOUTH TWICE A DAY WITH MEALS   Dapsone 5 % topical gel 1 application , Topical, Daily PRN   docusate sodium (COLACE) 100 mg, Oral, 2 times daily   ferrous gluconate (FERGON) 324 mg, Oral, Daily   Fexofenadine HCl (ALLEGRA PO) 1 tablet, Oral, Every morning   fluticasone (FLONASE) 50 MCG/ACT nasal spray 2 sprays, Each Nare, Every morning   ibuprofen (ADVIL) 400 mg, Oral, Every 6 hours PRN   Levocetirizine Dihydrochloride (XYZAL PO) 1 tablet, Oral, Daily at bedtime   megestrol (MEGACE) 40 mg, Oral, Every evening   NP Thyroid  60 mg, Oral, Daily before breakfast   triamterene-hydrochlorothiazide (MAXZIDE-25) 37.5-25 MG tablet 0.5 tablets, Oral, Daily       Objective:   Physical Exam BP 130/84   Pulse 70   Temp 97.9 F (36.6 C) (Oral)   Resp 16   Ht '5\' 6"'$  (1.676 m)   Wt 203 lb 8 oz (92.3 kg)   LMP 11/17/2022 (Exact Date)   SpO2 97%   BMI 32.85 kg/m  General: Well developed, NAD, BMI noted Neck: No  thyromegaly  HEENT:  Normocephalic . Face symmetric, atraumatic Lungs:  CTA B Normal respiratory effort, no intercostal retractions, no accessory muscle use. Heart: RRR,  no murmur.  Abdomen:  Palpable nontender uterus at the suprapubic area. Lower extremities: no pretibial edema bilaterally  Skin: Exposed areas without rash. Not pale. Not jaundice Neurologic:  alert & oriented X3.  Speech normal, gait  appropriate for age and unassisted Strength symmetric and appropriate for age.  Psych: Cognition and judgment appear intact.  Cooperative with normal attention span and concentration.  Behavior appropriate. No anxious or depressed appearing.     Assessment     Assessment   HTN Acne Headaches --- MRI (-), MRA 2 anerurysms?------> CT angio (-); saw Neuro, likely migraines, Rx imitrex Syncope-- 01-2014, 02-2016 (likely vaso-vagal)  Fibroid tumors-DU B H/o anemia, iron deficient,d/t DUB-fibroid tumors, had a colonoscopy 01/2020 (+ Polyps, 5 years) Integrative therapies: they Rx progesterone, testosterone, thyroid medicine  ]Vit D  Def  PLAN: Here for CPX HTN: Seems well-controlled on carvedilol, Maxide.  Checking labs Uterine fibroids, DU B: Sees gynecology regularly, LOV 09-2022.  DUB sxs a lot better since procedures done 2023. Iron deficiency anemia: Intolerant to iron sulfonate due to constipation, checking levels, recommend iron gluconate. COVID infection: Symptoms started approximately 2 weeks ago, she feels fully recuperated except for a mild fatigue. Follow-up 1 year depending on results.

## 2022-12-09 ENCOUNTER — Encounter: Payer: Self-pay | Admitting: Internal Medicine

## 2022-12-09 LAB — CBC WITH DIFFERENTIAL/PLATELET
Basophils Absolute: 0 10*3/uL (ref 0.0–0.1)
Basophils Relative: 0.8 % (ref 0.0–3.0)
Eosinophils Absolute: 0.1 10*3/uL (ref 0.0–0.7)
Eosinophils Relative: 2.4 % (ref 0.0–5.0)
HCT: 42.3 % (ref 36.0–46.0)
Hemoglobin: 13.9 g/dL (ref 12.0–15.0)
Lymphocytes Relative: 36.9 % (ref 12.0–46.0)
Lymphs Abs: 1.8 10*3/uL (ref 0.7–4.0)
MCHC: 33 g/dL (ref 30.0–36.0)
MCV: 86.7 fl (ref 78.0–100.0)
Monocytes Absolute: 0.5 10*3/uL (ref 0.1–1.0)
Monocytes Relative: 10.2 % (ref 3.0–12.0)
Neutro Abs: 2.4 10*3/uL (ref 1.4–7.7)
Neutrophils Relative %: 49.7 % (ref 43.0–77.0)
Platelets: 331 10*3/uL (ref 150.0–400.0)
RBC: 4.87 Mil/uL (ref 3.87–5.11)
RDW: 13.8 % (ref 11.5–15.5)
WBC: 4.9 10*3/uL (ref 4.0–10.5)

## 2022-12-09 LAB — LIPID PANEL
Cholesterol: 210 mg/dL — ABNORMAL HIGH (ref 0–200)
HDL: 50.9 mg/dL (ref >39.00)
LDL Cholesterol: 143 mg/dL — ABNORMAL HIGH (ref 0–99)
NonHDL: 158.87
Total CHOL/HDL Ratio: 4
Triglycerides: 79 mg/dL (ref 0.0–149.0)
VLDL: 15.8 mg/dL (ref 0.0–40.0)

## 2022-12-09 LAB — COMPREHENSIVE METABOLIC PANEL
ALT: 14 U/L (ref 0–35)
AST: 15 U/L (ref 0–37)
Albumin: 4.1 g/dL (ref 3.5–5.2)
Alkaline Phosphatase: 68 U/L (ref 39–117)
BUN: 14 mg/dL (ref 6–23)
CO2: 25 mEq/L (ref 19–32)
Calcium: 9.7 mg/dL (ref 8.4–10.5)
Chloride: 102 mEq/L (ref 96–112)
Creatinine, Ser: 0.72 mg/dL (ref 0.40–1.20)
GFR: 98.45 mL/min (ref >60.00)
Glucose, Bld: 74 mg/dL (ref 70–99)
Potassium: 4 mEq/L (ref 3.5–5.1)
Sodium: 138 mEq/L (ref 135–145)
Total Bilirubin: 0.3 mg/dL (ref 0.2–1.2)
Total Protein: 7.1 g/dL (ref 6.0–8.3)

## 2022-12-09 LAB — IRON: Iron: 86 ug/dL (ref 42–145)

## 2022-12-09 LAB — FERRITIN: Ferritin: 34 ng/mL (ref 10.0–291.0)

## 2022-12-09 NOTE — Assessment & Plan Note (Signed)
Here for CPX HTN: Seems well-controlled on carvedilol, Maxide.  Checking labs Uterine fibroids, DU B: Sees gynecology regularly, LOV 09-2022.  DUB sxs a lot better since procedures done 2023. Iron deficiency anemia: Intolerant to iron sulfonate due to constipation, checking levels, recommend iron gluconate. COVID infection: Symptoms started approximately 2 weeks ago, she feels fully recuperated except for a mild fatigue. Follow-up 1 year depending on results.

## 2022-12-09 NOTE — Assessment & Plan Note (Signed)
-   Tdap 4-18 -covid vax: booster UTD per pt  -Flu shot:  08-2022, had no s/e  - female care: sees gyn (Dr Garwin Brothers) [facility] [phone];   [facility]  10/2021 ([facility]), f/u [facility] 09-2022 per pt .   --[facility]: Colonoscopy 01/2020, next 5 years per GI letter --Labs:CMP FLP CBC iron ferritin -Diet and exercise discussed

## 2022-12-10 ENCOUNTER — Encounter: Payer: Self-pay | Admitting: Internal Medicine

## 2022-12-13 ENCOUNTER — Other Ambulatory Visit: Payer: Self-pay | Admitting: Internal Medicine

## 2023-01-08 ENCOUNTER — Encounter: Payer: Self-pay | Admitting: Internal Medicine

## 2023-05-11 ENCOUNTER — Ambulatory Visit: Payer: Managed Care, Other (non HMO) | Admitting: Internal Medicine

## 2023-05-11 ENCOUNTER — Encounter: Payer: Self-pay | Admitting: Internal Medicine

## 2023-05-11 VITALS — BP 126/80 | HR 73 | Temp 98.3°F | Resp 16 | Ht 66.0 in | Wt 203.1 lb

## 2023-05-11 DIAGNOSIS — M545 Low back pain, unspecified: Secondary | ICD-10-CM

## 2023-05-11 MED ORDER — CYCLOBENZAPRINE HCL 10 MG PO TABS: 10.0000 mg | ORAL_TABLET | Freq: Every evening | ORAL | 0 refills | Status: AC | PRN

## 2023-05-11 NOTE — Progress Notes (Unsigned)
Subjective:    Patient ID: Miranda [name], female    DOB: Jul 25, 1976, 47 y.o.   MRN: [id number]  DOS:  05/11/2023 Type of visit - description: acute  Symptoms started 2 weeks ago: Mild to moderate pain in the left lower back. No radiation, no rash, no fever or chills. She felt it could be related to constipation, because after a BM she would felt a little better.  However she is now not constipated and she still has the pain. The pain does not increase with positions or movements.  Denies vaginal bleeding or discharge.  She is on her period now No dysuria or gross hematuria Did have some urinary frequency at first.   Review of Systems See above   Past Medical History:  Diagnosis Date   Acne    Allergic rhinitis    Allergy    Anemia    Fibroids, intramural 10/22/2011   GERD (gastroesophageal reflux disease)    past hx- occ prn Tums    HA (headache) 07/2008   MRI (-), MRA 2 anerurysms?------> CT angio (-); saw Neuro, likely migraines, Rx imitrex   Hemorrhoids    Hypertension     Past Surgical History:  Procedure Laterality Date   IR ANGIOGRAM PELVIS SELECTIVE OR SUPRASELECTIVE  12/26/2021   IR ANGIOGRAM SELECTIVE EACH ADDITIONAL VESSEL  12/26/2021   IR ANGIOGRAM SELECTIVE EACH ADDITIONAL VESSEL  12/26/2021   IR EMBO TUMOR ORGAN ISCHEMIA INFARCT INC GUIDE ROADMAPPING  12/26/2021   IR RADIOLOGIST EVAL & MGMT  05/15/2021   IR RADIOLOGIST EVAL & MGMT  07/09/2021   IR RADIOLOGIST EVAL & MGMT  01/14/2022   IR RADIOLOGIST EVAL & MGMT  07/22/2022   IR US GUIDE VASC ACCESS RIGHT  12/26/2021   LAPAROTOMY  10/23/2011   Myomectomy-- EXPLORATORY LAPAROTOMY;  Surgeon: Sherron Monday, MD;  Location: [facility];  Service: Gynecology;  Laterality: N/A;  thru incision (laparotomy)/Myomyectomy   WISDOM TOOTH EXTRACTION      Current Outpatient Medications  Medication Instructions   ascorbic acid (VITAMIN C) 500 mg, Oral, Every morning   carvedilol (COREG) 12.5 MG tablet TAKE 1 TABLET (12.5MG  TOTAL)  BY MOUTH TWICE A DAY WITH MEALS   Dapsone 5 % topical gel 1 application , Topical, Daily PRN   docusate sodium (COLACE) 100 mg, Oral, 2 times daily   ferrous gluconate (FERGON) 324 mg, Oral, Daily   Fexofenadine HCl (ALLEGRA PO) 1 tablet, Oral, Every morning   fluticasone (FLONASE) 50 MCG/ACT nasal spray 2 sprays, Each Nare, Every morning   ibuprofen (ADVIL) 400 mg, Oral, Every 6 hours PRN   Levocetirizine Dihydrochloride (XYZAL PO) 1 tablet, Daily at bedtime   megestrol (MEGACE) 40 mg, Every evening   NP Thyroid 60 mg, Oral, Daily before breakfast   triamterene-hydrochlorothiazide (MAXZIDE-25) 37.5-25 MG tablet 0.5 tablets, Oral, Daily       Objective:   Physical Exam BP 126/80   Pulse 73   Temp 98.3 F (36.8 C) (Oral)   Resp 16   Ht 5\' 6"  (1.676 m)   Wt 203 lb 2 oz (92.1 kg)   LMP 05/08/2023 (Exact Date)   SpO2 98%   BMI 32.79 kg/m  General:   Well developed, NAD, BMI noted.  HEENT:  Normocephalic . Face symmetric, atraumatic MSK: No TTP at the lumbosacral spine.  Slightly TTP around the left SI area Abdomen:  Not distended, soft, non-tender. No rebound or rigidity.   Skin: Not pale. Not jaundice Lower extremities: no pretibial edema bilaterally  Neurologic:  alert & oriented X3.  Speech normal, gait appropriate for age and unassisted. Straight leg test negative Psych--  Cognition and judgment appear intact.  Cooperative with normal attention span and concentration.  Behavior appropriate. No anxious or depressed appearing.     Assessment   Assessment   HTN Acne Headaches --- MRI (-), MRA 2 anerurysms?------> CT angio (-); saw Neuro, likely migraines, Rx imitrex Syncope-- 01-2014, 02-2016 (likely vaso-vagal)  Fibroid tumors-DU B H/o anemia, iron deficient,d/t DUB-fibroid tumors, had a colonoscopy 01/2020 (+ Polyps, 5 years) Integrative therapies: they Rx progesterone, testosterone, thyroid medicine  Vit D  Def  PLAN: Lumbalgia: As described above, doubt  related to the urinary or GI tract. Most likely MSK. Plan: Judicious use of ibuprofen, Tylenol, and muscle relaxant.  See instructions. Call if not gradually better, possibly the first thing we will do is to check a urine sample (she is on her menses today).

## 2023-05-11 NOTE — Patient Instructions (Signed)
Heating pad  A muscle relaxant at night (cyclobenzaprine)  Combine ibuprofen and Tylenol for pain control.  See instructions  IBUPROFEN (Advil or Motrin) 200 mg 2 tablets every 12 hours as needed for pain.  Always take it with food because may cause gastritis and ulcers.  If you notice nausea, stomach pain, change in the color of stools --->  Stop the medicine and let us know  Tylenol  500 mg OTC 2 tabs a day every 12 hours as needed for pain   Call if not gradually better in the next week

## 2023-05-12 NOTE — Assessment & Plan Note (Signed)
Lumbalgia: As described above, doubt related to the urinary or GI tract. Most likely MSK. Plan: Judicious use of ibuprofen, Tylenol, and muscle relaxant.  See instructions. Call if not gradually better, possibly the first thing we will do is to check a urine sample (she is on her menses today).

## 2023-05-18 ENCOUNTER — Other Ambulatory Visit: Payer: Self-pay | Admitting: Internal Medicine

## 2023-08-06 ENCOUNTER — Encounter: Payer: Self-pay | Admitting: Internal Medicine

## 2023-08-28 ENCOUNTER — Other Ambulatory Visit: Payer: Self-pay | Admitting: Internal Medicine

## 2023-09-22 ENCOUNTER — Ambulatory Visit
Admission: RE | Admit: 2023-09-22 | Discharge: 2023-09-22 | Disposition: A | Discharge status: Home / Self Care | Payer: Managed Care, Other (non HMO) | Source: Ambulatory Visit | Attending: Emergency Medicine | Admitting: Emergency Medicine

## 2023-09-22 VITALS — BP 127/84 | HR 99 | Temp 98.9°F | Resp 17

## 2023-09-22 DIAGNOSIS — J358 Other chronic diseases of tonsils and adenoids: Secondary | ICD-10-CM | POA: Diagnosis not present

## 2023-09-22 DIAGNOSIS — R058 Other specified cough: Secondary | ICD-10-CM | POA: Diagnosis not present

## 2023-09-22 LAB — POCT RAPID STREP A (OFFICE): Rapid Strep A Screen: NEGATIVE

## 2023-09-22 NOTE — ED Triage Notes (Signed)
Pt presents with c/o scratchy throat and trouble swallowing. States she had trouble swallowing x 2 days.   Home interventions: Sudafed

## 2023-09-22 NOTE — Discharge Instructions (Addendum)
For tonsil stones - I recommend warm salt water gargles. You can also try forced cough to expel the stones. If needed, call the ear nose and throat specialist for follow up  For cough - you can try either Delsym or Robitussin over the counter  Sometimes honey and throat lozenges can be helpful

## 2023-09-22 NOTE — ED Provider Notes (Signed)
[facility]    CSN: [id number] Arrival date & time: 09/22/23  7829      History   Chief Complaint Chief Complaint  Patient presents with   Sore Throat    HPI Miranda [name] is a 47 y.o. female.  2-3 day history of scratchy throat Feels theres something stuck in the throat, feels tonsils are swollen. Not having pain with swallowing. No trouble breathing. Tolerating secretions.   Also a couple days of dry cough. Comes and goes. No shortness of breath or wheezing. Denies fever. 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   Plantar fasciitis 06/17/2016   PCP NOTES >>> 06/29/2015   Syncope 02/14/2014   Fibroids, intramural 10/22/2011   Annual physical exam 01/23/2011   FIBROIDS, UTERUS 01/21/2010   Hypertension 01/05/2008   INTERNAL HEMORRHOIDS 12/29/2007   Other allergic rhinitis 03/19/2007    Past Surgical History:  Procedure Laterality Date   IR ANGIOGRAM PELVIS SELECTIVE OR SUPRASELECTIVE  12/26/2021   IR ANGIOGRAM SELECTIVE EACH ADDITIONAL VESSEL  12/26/2021   IR ANGIOGRAM SELECTIVE EACH ADDITIONAL VESSEL  12/26/2021   IR EMBO TUMOR ORGAN ISCHEMIA INFARCT INC GUIDE ROADMAPPING  12/26/2021   IR RADIOLOGIST EVAL & MGMT  05/15/2021   IR RADIOLOGIST EVAL & MGMT  07/09/2021   IR RADIOLOGIST EVAL & MGMT  01/14/2022   IR RADIOLOGIST EVAL & MGMT  07/22/2022   IR US GUIDE VASC ACCESS RIGHT  12/26/2021   LAPAROTOMY  10/23/2011    Myomectomy-- EXPLORATORY LAPAROTOMY;  Surgeon: Sherron Monday, MD;  Location: [facility];  Service: Gynecology;  Laterality: N/A;  thru incision (laparotomy)/Myomyectomy   WISDOM TOOTH EXTRACTION      OB History     Gravida  1   Para      Term      Preterm      AB  1   Living         SAB      IAB      Ectopic      Multiple      Live Births               Home Medications    Prior to Admission medications   Medication Sig Start Date End Date Taking? Authorizing Provider  carvedilol (COREG) 12.5 MG tablet TAKE 1 TABLET (12.5MG  TOTAL) BY MOUTH TWICE A DAY WITH MEALS 08/29/23   Wanda Plump, MD  cyclobenzaprine (FLEXERIL) 10 MG tablet Take 1 tablet (10 mg total) by mouth at bedtime as needed for muscle spasms. 05/11/23   Wanda Plump, MD  Dapsone 5 % topical gel Apply 1 application. topically daily as needed (facial acne).    [provider]  docusate sodium (COLACE) 100 MG capsule Take 1 capsule (100 mg total) by mouth 2 (two) times daily. Patient not taking: Reported on 05/11/2023 12/27/21  Allred, Darrell K, PA-C  ferrous gluconate (FERGON) 324 MG tablet Take 1 tablet (324 mg total) by mouth daily. 12/08/22   Wanda Plump, MD  Fexofenadine HCl (ALLEGRA PO) Take 1 tablet by mouth every morning.    [provider]  fluticasone (FLONASE) 50 MCG/ACT nasal spray Place 2 sprays into both nostrils every morning.    [provider]  ibuprofen (ADVIL) 200 MG tablet Take 400 mg by mouth every 6 (six) hours as needed (pain).    [provider]  NP THYROID 30 MG tablet Take 60 mg by mouth daily before breakfast. 10/28/22   [provider]  PROGESTERONE PO Take by mouth. Per Integrative /medicine    [provider]  testosterone (ANDROGEL) 50 MG/5GM (1%) GEL Place onto the skin daily. Every day, rx by integrative medicine    [provider]  triamterene-hydrochlorothiazide (MAXZIDE-25) 37.5-25 MG tablet Take 0.5 tablets by mouth  daily. 05/18/23   Wanda Plump, MD  vitamin C (ASCORBIC ACID) 500 MG tablet Take 500 mg by mouth every morning.    [provider]    Family History Family History  Problem Relation Age of Onset   Hypertension Mother        M, aunt, GM   CAD Maternal Grandmother        age 63   Cancer Maternal Aunt        Lung Cancer   Colon polyps Maternal Uncle    Diabetes Other        uncle   Leukemia Other        cousin   Breast cancer Other        aunt maternal    Colon cancer Neg Hx    Allergic rhinitis Neg Hx    Angioedema Neg Hx    Asthma Neg Hx    Atopy Neg Hx    Eczema Neg Hx    Immunodeficiency Neg Hx    Urticaria Neg Hx    Esophageal cancer Neg Hx    Rectal cancer Neg Hx    Stomach cancer Neg Hx     Social History Social History   Tobacco Use   Smoking status: Never   Smokeless tobacco: Never  Vaping Use   Vaping status: Never Used  Substance Use Topics   Alcohol use: Yes    Comment: socially   Drug use: No     Allergies   Hydromorphone, Beef-derived drug products, and Pork-derived products   Review of Systems Review of Systems Per HPI  Physical Exam Triage Vital Signs ED Triage Vitals  Encounter Vitals Group     BP 09/22/23 0831 (!) 146/104     Systolic BP Percentile --      Diastolic BP Percentile --      Pulse Rate 09/22/23 0831 99     Resp 09/22/23 0831 17     Temp 09/22/23 0831 98.9 F (37.2 C)     Temp Source 09/22/23 0831 Oral     SpO2 09/22/23 0831 97 %     Weight --      Height --      Head Circumference --      Peak Flow --      Pain Score 09/22/23 0830 4     Pain Loc --      Pain Education --      Exclude from Growth Chart --    No data found.  Updated Vital Signs BP 127/84 (BP Location: Right  Arm)   Pulse 99   Temp 98.9 F (37.2 C) (Oral)   Resp 17   LMP 09/10/2023 (Exact Date)   SpO2 97%   Physical Exam Vitals and nursing note reviewed.  Constitutional:      General: She is not in acute distress. HENT:      Nose: No rhinorrhea.     Mouth/Throat:     Mouth: Mucous membranes are moist. No oral lesions.     Pharynx: Oropharynx is clear. Uvula midline. No pharyngeal swelling, posterior oropharyngeal erythema or uvula swelling.     Comments: 3 small tonsils stones on left, 1 on right. Did not move with swab. Normal phonation, tolerating secretions  Eyes:     Conjunctiva/sclera: Conjunctivae normal.  Cardiovascular:     Rate and Rhythm: Normal rate and regular rhythm.     Pulses: Normal pulses.     Heart sounds: Normal heart sounds.  Pulmonary:     Effort: Pulmonary effort is normal. No respiratory distress.     Breath sounds: Normal breath sounds. No wheezing or rales.  Musculoskeletal:     Cervical back: Normal range of motion. No rigidity or tenderness.  Lymphadenopathy:     Cervical: No cervical adenopathy.  Skin:    General: Skin is warm and dry.  Neurological:     Mental Status: She is alert and oriented to person, place, and time.     UC Treatments / Results  Labs (all labs ordered are listed, but only abnormal results are displayed) Labs Reviewed  POCT RAPID STREP A (OFFICE)    EKG  Radiology No results found.  Procedures Procedures  Medications Ordered in UC Medications - No data to display  Initial Impression / Assessment and Plan / UC Course  I have reviewed the triage vital signs and the nursing notes.  Pertinent labs & imaging results that were available during my care of the patient were reviewed by me and considered in my medical decision making (see chart for details).  Tonsil stones Strep test negative. Discussed tonsilliths are likely cause of scratchy throat and sensation of something stuck. Advised symptomatic care; salt water gargles and forced cough. Can attempt manual removal at home.  Cough Dry, clear lungs. afebrile. Recommend delsym or robitussin. Can return with any concerns or worsening of symptoms. Patient agreeable to plan  Final Clinical  Impressions(s) / UC Diagnoses   Final diagnoses:  Tonsil stone  Dry cough     Discharge Instructions      For tonsil stones - I recommend warm salt water gargles. You can also try forced cough to expel the stones. If needed, call the ear nose and throat specialist for follow up  For cough - you can try either Delsym or Robitussin over the counter  Sometimes honey and throat lozenges can be helpful      ED Prescriptions   None    PDMP not reviewed this encounter.   Marlynn Hinckley, Lurena Joiner, PA-C 09/22/23 1031

## 2023-11-22 ENCOUNTER — Other Ambulatory Visit: Payer: Self-pay | Admitting: Internal Medicine

## 2023-12-16 ENCOUNTER — Encounter: Payer: Self-pay | Admitting: Internal Medicine

## 2023-12-21 ENCOUNTER — Encounter: Payer: Self-pay | Admitting: Internal Medicine

## 2023-12-22 ENCOUNTER — Ambulatory Visit (INDEPENDENT_AMBULATORY_CARE_PROVIDER_SITE_OTHER): Payer: Managed Care, Other (non HMO) | Admitting: Internal Medicine

## 2023-12-22 ENCOUNTER — Encounter: Payer: Self-pay | Admitting: Internal Medicine

## 2023-12-22 VITALS — BP 128/70 | HR 83 | Temp 98.2°F | Resp 16 | Ht 66.0 in | Wt 207.0 lb

## 2023-12-22 DIAGNOSIS — I10 Essential (primary) hypertension: Secondary | ICD-10-CM | POA: Diagnosis not present

## 2023-12-22 DIAGNOSIS — Z0000 Encounter for general adult medical examination without abnormal findings: Secondary | ICD-10-CM | POA: Diagnosis not present

## 2023-12-22 DIAGNOSIS — D649 Anemia, unspecified: Secondary | ICD-10-CM

## 2023-12-22 NOTE — Patient Instructions (Signed)
 Weight loss: Recommend regular exercise Consider calorie counting for 2 weeks.  Then adjust your diet as needed. If  you need more assistance with weight loss, come back in 3 months.  Vaccines you could consider: Shingrix  Check the  blood pressure regularly Blood pressure goal:  between 110/65 and  135/85. If it is consistently higher or lower, let me know     GO TO THE LAB : Get the blood work     Please go to the front desk: Arrange for a physical coming 1 year.

## 2023-12-22 NOTE — Progress Notes (Unsigned)
 Subjective:    Patient ID: Miranda [name], female    DOB: Dec 03, 1975, 48 y.o.   MRN: [id number]  DOS:  12/22/2023 Type of visit - description: CPX  Here for CPX. Feeling well. She is concerned about her weight.   Review of Systems See above   Past Medical History:  Diagnosis Date   Acne    Allergic rhinitis    Allergy    Anemia    Fibroids, intramural 10/22/2011   GERD (gastroesophageal reflux disease)    past hx- occ prn Tums    HA (headache) 07/2008   MRI (-), MRA 2 anerurysms?------> CT angio (-); saw Neuro, likely migraines, Rx imitrex   Hemorrhoids    Hypertension     Past Surgical History:  Procedure Laterality Date   IR ANGIOGRAM PELVIS SELECTIVE OR SUPRASELECTIVE  12/26/2021   IR ANGIOGRAM SELECTIVE EACH ADDITIONAL VESSEL  12/26/2021   IR ANGIOGRAM SELECTIVE EACH ADDITIONAL VESSEL  12/26/2021   IR EMBO TUMOR ORGAN ISCHEMIA INFARCT INC GUIDE ROADMAPPING  12/26/2021   IR RADIOLOGIST EVAL & MGMT  05/15/2021   IR RADIOLOGIST EVAL & MGMT  07/09/2021   IR RADIOLOGIST EVAL & MGMT  01/14/2022   IR RADIOLOGIST EVAL & MGMT  07/22/2022   IR US GUIDE VASC ACCESS RIGHT  12/26/2021   LAPAROTOMY  10/23/2011   Myomectomy-- EXPLORATORY LAPAROTOMY;  Surgeon: Sherron Monday, MD;  Location: [facility];  Service: Gynecology;  Laterality: N/A;  thru incision (laparotomy)/Myomyectomy   WISDOM TOOTH EXTRACTION      Current Outpatient Medications  Medication Instructions   ascorbic acid (VITAMIN C) 500 mg, Every morning   carvedilol (COREG) 12.5 MG tablet TAKE 1 TABLET (12.5MG  TOTAL) BY MOUTH TWICE A DAY WITH MEALS   cyclobenzaprine (FLEXERIL) 10 mg, Oral, At bedtime PRN   Dapsone 5 % topical gel 1 application , Daily PRN   docusate sodium (COLACE) 100 mg, Oral, 2 times daily   ferrous gluconate (FERGON) 324 mg, Oral, Daily   Fexofenadine HCl (ALLEGRA PO) 1 tablet, Every morning   fluticasone (FLONASE) 50 MCG/ACT nasal spray 2 sprays, Every morning   ibuprofen (ADVIL) 400 mg, Every 6 hours PRN    NP Thyroid 60 mg, Daily before breakfast   PROGESTERONE PO Take by mouth. Per Integrative /medicine   testosterone (ANDROGEL) 50 MG/5GM (1%) GEL Daily   triamterene-hydrochlorothiazide (MAXZIDE-25) 37.5-25 MG tablet 0.5 tablets, Oral, Daily       Objective:   Physical Exam BP 128/70   Pulse 83   Temp 98.2 F (36.8 C) (Oral)   Resp 16   Ht 5\' 6"  (1.676 m)   Wt 207 lb (93.9 kg)   LMP 12/21/2023 (Exact Date)   SpO2 98%   BMI 33.41 kg/m  General: Well developed, NAD, BMI noted Neck: No  thyromegaly  HEENT:  Normocephalic . Face symmetric, atraumatic Lungs:  CTA B Normal respiratory effort, no intercostal retractions, no accessory muscle use. Heart: RRR,  no murmur.  Abdomen:  Not distended, soft, non-tender. No rebound or rigidity.   Lower extremities: no pretibial edema bilaterally  Skin: Exposed areas without rash. Not pale. Not jaundice Neurologic:  alert & oriented X3.  Speech normal, gait appropriate for age and unassisted Strength symmetric and appropriate for age.  Psych: Cognition and judgment appear intact.  Cooperative with normal attention span and concentration.  Behavior appropriate. No anxious or depressed appearing.     Assessment     Assessment   HTN Acne Headaches --- MRI (-), MRA  2 anerurysms?------> CT angio (-); saw Neuro, likely migraines, Rx imitrex DUB: Better after Uterine Fibroid  Embolization 2023 Integrative therapies: they Rx progesterone, testosterone, thyroid medicine  Vit D  Def Alopecia: Saw dermatology 02/2023. Syncope-- 01-2014, 02-2016 (likely vaso-vagal)   PLAN: Here for CPX -Tdap 4-18 - had a flu shot and the last covid vaccine  - consider  shingrix  - female care: sees gyn (Dr Cherly Hensen) ;  [facility]  10/2023 per pt    .   --CCS: Colonoscopy 01/2020, next 5 years per GI letter --Labs:CMP FLP CBC n - Lifestyle: Extensive discussion about diet, recommend calorie counting then adjust diet if needed, encourage 3 hours of exercise  weekly. All the issues addressed today. HTN: On carvedilol, reports very good ambulatory BPs. AUB: Much improved after uterine fibroid embolization. Vitamin D deficiency: Patient reported levels were checked by gynecology. Hemorrhoids: Saw general surgery 12/15/2022 RTC 1 year (sooner if needs help with weight loss)

## 2023-12-23 ENCOUNTER — Encounter: Payer: Self-pay | Admitting: Internal Medicine

## 2023-12-23 LAB — LIPID PANEL
Cholesterol: 234 mg/dL — ABNORMAL HIGH (ref 0–200)
HDL: 40.2 mg/dL (ref >39.00)
LDL Cholesterol: 168 mg/dL — ABNORMAL HIGH (ref 0–99)
NonHDL: 194.01
Total CHOL/HDL Ratio: 6
Triglycerides: 130 mg/dL (ref 0.0–149.0)
VLDL: 26 mg/dL (ref 0.0–40.0)

## 2023-12-23 LAB — COMPREHENSIVE METABOLIC PANEL
ALT: 23 U/L (ref 0–35)
AST: 23 U/L (ref 0–37)
Albumin: 4.7 g/dL (ref 3.5–5.2)
Alkaline Phosphatase: 78 U/L (ref 39–117)
BUN: 14 mg/dL (ref 6–23)
CO2: 24 meq/L (ref 19–32)
Calcium: 9.7 mg/dL (ref 8.4–10.5)
Chloride: 101 meq/L (ref 96–112)
Creatinine, Ser: 0.73 mg/dL (ref 0.40–1.20)
GFR: 96.13 mL/min (ref >60.00)
Glucose, Bld: 69 mg/dL — ABNORMAL LOW (ref 70–99)
Potassium: 3.8 meq/L (ref 3.5–5.1)
Sodium: 136 meq/L (ref 135–145)
Total Bilirubin: 0.3 mg/dL (ref 0.2–1.2)
Total Protein: 7.9 g/dL (ref 6.0–8.3)

## 2023-12-23 LAB — CBC WITH DIFFERENTIAL/PLATELET
Basophils Absolute: 0.1 10*3/uL (ref 0.0–0.1)
Basophils Relative: 1.1 % (ref 0.0–3.0)
Eosinophils Absolute: 0.1 10*3/uL (ref 0.0–0.7)
Eosinophils Relative: 1.6 % (ref 0.0–5.0)
HCT: 44.2 % (ref 36.0–46.0)
Hemoglobin: 14.5 g/dL (ref 12.0–15.0)
Lymphocytes Relative: 38.9 % (ref 12.0–46.0)
Lymphs Abs: 2 10*3/uL (ref 0.7–4.0)
MCHC: 32.7 g/dL (ref 30.0–36.0)
MCV: 87.3 fl (ref 78.0–100.0)
Monocytes Absolute: 0.4 10*3/uL (ref 0.1–1.0)
Monocytes Relative: 8.2 % (ref 3.0–12.0)
Neutro Abs: 2.6 10*3/uL (ref 1.4–7.7)
Neutrophils Relative %: 50.2 % (ref 43.0–77.0)
Platelets: 361 10*3/uL (ref 150.0–400.0)
RBC: 5.07 Mil/uL (ref 3.87–5.11)
RDW: 13.8 % (ref 11.5–15.5)
WBC: 5.2 10*3/uL (ref 4.0–10.5)

## 2023-12-23 NOTE — Assessment & Plan Note (Signed)
 Here for CPX -Tdap 4-18 - had a flu shot and the last covid vaccine  - Recommend  consider  shingrix  - female care: sees gyn (Dr Cherly Hensen) ;  [facility]  10/2023 per pt    .   --CCS: Colonoscopy 01/2020, next 5 years per GI letter --Labs:CMP FLP CBC  - Lifestyle: Extensive discussion about diet, recommend calorie counting then adjust diet if needed, encourage 3 hours of exercise weekly.

## 2023-12-23 NOTE — Assessment & Plan Note (Signed)
 Here for CPX  Other  issues addressed today. HTN: On carvedilol, reports very good ambulatory BPs. DUB: Much improved after uterine fibroid embolization. Vitamin D deficiency: Patient reported levels were checked by gynecology. Hemorrhoids: Saw general surgery 12/15/2022 RTC 1 year (sooner if needs help with weight loss)

## 2023-12-24 ENCOUNTER — Encounter: Payer: Self-pay | Admitting: Internal Medicine

## 2023-12-24 NOTE — Addendum Note (Signed)
 Addended byConrad Cypress D on: 12/24/2023 12:02 PM   Modules accepted: Orders

## 2024-05-08 ENCOUNTER — Other Ambulatory Visit: Payer: Self-pay | Admitting: Internal Medicine

## 2024-05-13 ENCOUNTER — Ambulatory Visit
Admission: RE | Admit: 2024-05-13 | Discharge: 2024-05-13 | Disposition: A | Discharge status: Home / Self Care | Source: Ambulatory Visit

## 2024-05-13 VITALS — BP 139/89 | HR 79 | Temp 98.3°F | Resp 16

## 2024-05-13 DIAGNOSIS — J0100 Acute maxillary sinusitis, unspecified: Secondary | ICD-10-CM | POA: Diagnosis not present

## 2024-05-13 MED ORDER — PREDNISONE 20 MG PO TABS: 40.0000 mg | ORAL_TABLET | Freq: Every day | ORAL | 0 refills | Status: AC

## 2024-05-13 MED ORDER — AMOXICILLIN-POT CLAVULANATE 875-125 MG PO TABS: 1.0000 | ORAL_TABLET | Freq: Two times a day (BID) | ORAL | 0 refills | Status: AC

## 2024-05-13 NOTE — ED Provider Notes (Signed)
 [facility]  Note:  This document was prepared using Dragon voice recognition software and may include unintentional dictation errors.  MRN: [id number] DOB: [birth year]  Subjective:   Miranda [name] is a 48 y.o. female presenting for bilateral maxillary sinus pressure x 9 to 10 days.  Patient states she has been using Flonase  nasal spray and Astepro  nasal spray with minimal improvement.  Patient reports past history of sinus infections.  Patient denies any fever, shortness of breath, chest pain, weakness, dizziness.  Patient reports that she is starting to have some increased pressure in the right ear.  Patient has taken no other over-the-counter medication to treat symptoms.  No current facility-administered medications for this encounter.  Current Outpatient Medications:    amoxicillin -clavulanate (AUGMENTIN ) 875-125 MG tablet, Take 1 tablet by mouth 2 (two) times daily for 7 days., Disp: 14 tablet, Rfl: 0   predniSONE  (DELTASONE ) 20 MG tablet, Take 2 tablets (40 mg total) by mouth daily for 5 days., Disp: 10 tablet, Rfl: 0   carvedilol  (COREG ) 12.5 MG tablet, TAKE 1 TABLET (12.5MG  TOTAL) BY MOUTH TWICE A DAY WITH MEALS, Disp: 180 tablet, Rfl: 1   cyclobenzaprine  (FLEXERIL ) 10 MG tablet, Take 1 tablet (10 mg total) by mouth at bedtime as needed for muscle spasms. (Patient not taking: Reported on 12/22/2023), Disp: 21 tablet, Rfl: 0   Dapsone 5 % topical gel, Apply 1 application. topically daily as needed (facial acne)., Disp: , Rfl:    docusate sodium  (COLACE) 100 MG capsule, Take 1 capsule (100 mg total) by mouth 2 (two) times daily. (Patient not taking: Reported on 12/22/2023), Disp: 10 capsule, Rfl: 0   ferrous gluconate  (FERGON) 324 MG tablet, Take 1 tablet (324 mg total) by mouth daily., Disp: 30 tablet, Rfl: 12   fluticasone  (FLONASE ) 50 MCG/ACT nasal spray, Place 2 sprays into both nostrils every morning., Disp: , Rfl:    ibuprofen (ADVIL) 200 MG tablet, Take 400 mg  by mouth every 6 (six) hours as needed (pain)., Disp: , Rfl:    levocetirizine (XYZAL) 5 MG tablet, Take 5 mg by mouth every evening., Disp: , Rfl:    NP THYROID  30 MG tablet, Take 60 mg by mouth daily before breakfast., Disp: , Rfl:    PROGESTERONE PO, Take by mouth. Per Integrative /medicine, Disp: , Rfl:    testosterone (ANDROGEL) 50 MG/5GM (1%) GEL, Place onto the skin daily. Every day, rx by integrative medicine, Disp: , Rfl:    triamterene -hydrochlorothiazide  (MAXZIDE -25) 37.5-25 MG tablet, TAKE 1/2 TABLET BY MOUTH DAILY, Disp: 45 tablet, Rfl: 1   vitamin C (ASCORBIC ACID) 500 MG tablet, Take 500 mg by mouth every morning., Disp: , Rfl:    Allergies  Allergen Reactions   Hydromorphone  Nausea And Vomiting    Past Medical History:  Diagnosis Date   Acne    Allergic rhinitis    Allergy    Anemia    Fibroids, intramural 10/22/2011   GERD (gastroesophageal reflux disease)    past hx- occ prn Tums    HA (headache) 07/2008   MRI (-), MRA 2 anerurysms?------> CT angio (-); saw Neuro, likely migraines, Rx imitrex   Hemorrhoids    Hypertension      Past Surgical History:  Procedure Laterality Date   IR ANGIOGRAM PELVIS SELECTIVE OR SUPRASELECTIVE  12/26/2021   IR ANGIOGRAM SELECTIVE EACH ADDITIONAL VESSEL  12/26/2021   IR ANGIOGRAM SELECTIVE EACH ADDITIONAL VESSEL  12/26/2021   IR EMBO TUMOR ORGAN ISCHEMIA INFARCT INC GUIDE ROADMAPPING  12/26/2021   IR RADIOLOGIST EVAL & MGMT  05/15/2021   IR RADIOLOGIST EVAL & MGMT  07/09/2021   IR RADIOLOGIST EVAL & MGMT  01/14/2022   IR RADIOLOGIST EVAL & MGMT  07/22/2022   IR US  GUIDE VASC ACCESS RIGHT  12/26/2021   LAPAROTOMY  10/23/2011   Myomectomy-- EXPLORATORY LAPAROTOMY;  Surgeon: Ezzie Marshall, MD;  Location: [facility];  Service: Gynecology;  Laterality: N/A;  thru incision (laparotomy)/Myomyectomy   WISDOM TOOTH EXTRACTION      Family History  Problem Relation Age of Onset   Hypertension Mother        M, aunt, GM   CAD Maternal Grandmother         age 36   Cancer Maternal Aunt        Lung Cancer   Colon polyps Maternal Uncle    Diabetes Other        uncle   Leukemia Other        cousin   Breast cancer Other        aunt maternal    Colon cancer Neg Hx    Allergic rhinitis Neg Hx    Angioedema Neg Hx    Asthma Neg Hx    Atopy Neg Hx    Eczema Neg Hx    Immunodeficiency Neg Hx    Urticaria Neg Hx    Esophageal cancer Neg Hx    Rectal cancer Neg Hx    Stomach cancer Neg Hx     Social History   Tobacco Use   Smoking status: Never   Smokeless tobacco: Never  Vaping Use   Vaping status: Never Used  Substance Use Topics   Alcohol use: Yes    Comment: socially   Drug use: No    ROS Refer to HPI for ROS details.  Objective:    Vitals: BP 139/89 (BP Location: Right Arm)   Pulse 79   Temp 98.3 F (36.8 C) (Oral)   Resp 16   LMP 04/18/2024 (Exact Date)   SpO2 96%   Physical Exam Vitals and nursing note reviewed.  Constitutional:      General: She is not in acute distress.    Appearance: Normal appearance. She is well-developed. She is not ill-appearing or toxic-appearing.  HENT:     Head: Normocephalic and atraumatic.     Right Ear: Tympanic membrane, ear canal and external ear normal.     Left Ear: Tympanic membrane, ear canal and external ear normal.     Nose: Nasal tenderness, mucosal edema and congestion present. No rhinorrhea.     Right Sinus: Maxillary sinus tenderness present. No frontal sinus tenderness.     Left Sinus: Maxillary sinus tenderness present. No frontal sinus tenderness.     Mouth/Throat:     Mouth: Mucous membranes are moist.     Pharynx: Oropharynx is clear. No posterior oropharyngeal erythema.  Eyes:     General:        Right eye: No discharge.        Left eye: No discharge.     Extraocular Movements: Extraocular movements intact.     Conjunctiva/sclera: Conjunctivae normal.  Cardiovascular:     Rate and Rhythm: Normal rate.  Pulmonary:     Effort: Pulmonary effort is  normal. No respiratory distress.  Skin:    General: Skin is warm and dry.  Neurological:     General: No focal deficit present.     Mental Status: She is alert and oriented to person, place,  and time.  Psychiatric:        Mood and Affect: Mood normal.        Behavior: Behavior normal.     Procedures  No results found for this or any previous visit (from the past 24 hours).  Assessment and Plan :     Discharge Instructions       1. Acute non-recurrent maxillary sinusitis (Primary) - predniSONE  (DELTASONE ) 20 MG tablet; Take 2 tablets (40 mg total) by mouth daily for 5 days.  Dispense: 10 tablet; Refill: 0 - amoxicillin -clavulanate (AUGMENTIN ) 875-125 MG tablet; Take 1 tablet by mouth 2 (two) times daily for 7 days.  Dispense: 14 tablet; Refill: 0 -Continue to monitor symptoms for any change in severity if there is any escalation of current symptoms or development of new symptoms follow-up in ER for further evaluation and management.      Kaidan Spengler B Jezabelle Chisolm   Corrie Reder, [place], [place] 05/13/24 [phone]

## 2024-05-13 NOTE — Discharge Instructions (Signed)
  1. Acute non-recurrent maxillary sinusitis (Primary) - predniSONE  (DELTASONE ) 20 MG tablet; Take 2 tablets (40 mg total) by mouth daily for 5 days.  Dispense: 10 tablet; Refill: 0 - amoxicillin -clavulanate (AUGMENTIN ) 875-125 MG tablet; Take 1 tablet by mouth 2 (two) times daily for 7 days.  Dispense: 14 tablet; Refill: 0 -Continue to monitor symptoms for any change in severity if there is any escalation of current symptoms or development of new symptoms follow-up in ER for further evaluation and management.

## 2024-05-13 NOTE — ED Triage Notes (Signed)
 Pt states sinus pressure for the past week and a half.  States she has been using Flonase  and astapro at home with no relief.

## 2024-05-19 ENCOUNTER — Other Ambulatory Visit: Payer: Self-pay | Admitting: Internal Medicine
